# Patient Record
Sex: Male | Born: 1945 | Race: White | Hispanic: No | Marital: Married | Smoking: Never smoker
Health system: Southern US, Community
[De-identification: ages and names within clinical notes are randomized; demographics above are authoritative.]

## PROBLEM LIST (undated history)

## (undated) DIAGNOSIS — C61 Malignant neoplasm of prostate: Secondary | ICD-10-CM

## (undated) DIAGNOSIS — H409 Unspecified glaucoma: Secondary | ICD-10-CM

## (undated) DIAGNOSIS — S61209A Unspecified open wound of unspecified finger without damage to nail, initial encounter: Secondary | ICD-10-CM

## (undated) DIAGNOSIS — S61219A Laceration without foreign body of unspecified finger without damage to nail, initial encounter: Secondary | ICD-10-CM

## (undated) HISTORY — PX: PROSTATE BIOPSY: SHX241

## (undated) HISTORY — PX: OTHER SURGICAL HISTORY: SHX169

## (undated) HISTORY — PX: EYE SURGERY: SHX253

---

## 2009-08-07 HISTORY — PX: OTHER SURGICAL HISTORY: SHX169

## 2011-09-07 DIAGNOSIS — L259 Unspecified contact dermatitis, unspecified cause: Secondary | ICD-10-CM | POA: Diagnosis not present

## 2011-09-07 DIAGNOSIS — D1801 Hemangioma of skin and subcutaneous tissue: Secondary | ICD-10-CM | POA: Diagnosis not present

## 2011-09-07 DIAGNOSIS — D235 Other benign neoplasm of skin of trunk: Secondary | ICD-10-CM | POA: Diagnosis not present

## 2011-10-11 DIAGNOSIS — Z131 Encounter for screening for diabetes mellitus: Secondary | ICD-10-CM | POA: Diagnosis not present

## 2011-10-11 DIAGNOSIS — Z23 Encounter for immunization: Secondary | ICD-10-CM | POA: Diagnosis not present

## 2011-10-11 DIAGNOSIS — Z0000 Encounter for general adult medical examination without abnormal findings: Secondary | ICD-10-CM | POA: Diagnosis not present

## 2011-10-11 DIAGNOSIS — Z136 Encounter for screening for cardiovascular disorders: Secondary | ICD-10-CM | POA: Diagnosis not present

## 2011-10-15 DIAGNOSIS — L03119 Cellulitis of unspecified part of limb: Secondary | ICD-10-CM | POA: Diagnosis not present

## 2011-12-11 DIAGNOSIS — L719 Rosacea, unspecified: Secondary | ICD-10-CM | POA: Diagnosis not present

## 2012-02-05 DIAGNOSIS — L739 Follicular disorder, unspecified: Secondary | ICD-10-CM | POA: Diagnosis not present

## 2012-02-05 DIAGNOSIS — L719 Rosacea, unspecified: Secondary | ICD-10-CM | POA: Diagnosis not present

## 2012-02-05 DIAGNOSIS — D235 Other benign neoplasm of skin of trunk: Secondary | ICD-10-CM | POA: Diagnosis not present

## 2012-03-25 DIAGNOSIS — M659 Synovitis and tenosynovitis, unspecified: Secondary | ICD-10-CM | POA: Diagnosis not present

## 2012-05-09 DIAGNOSIS — N401 Enlarged prostate with lower urinary tract symptoms: Secondary | ICD-10-CM | POA: Diagnosis not present

## 2012-05-09 DIAGNOSIS — R972 Elevated prostate specific antigen [PSA]: Secondary | ICD-10-CM | POA: Diagnosis not present

## 2012-10-04 DIAGNOSIS — H47099 Other disorders of optic nerve, not elsewhere classified, unspecified eye: Secondary | ICD-10-CM | POA: Diagnosis not present

## 2013-02-05 DIAGNOSIS — L819 Disorder of pigmentation, unspecified: Secondary | ICD-10-CM | POA: Diagnosis not present

## 2013-02-05 DIAGNOSIS — D235 Other benign neoplasm of skin of trunk: Secondary | ICD-10-CM | POA: Diagnosis not present

## 2013-05-01 DIAGNOSIS — N401 Enlarged prostate with lower urinary tract symptoms: Secondary | ICD-10-CM | POA: Diagnosis not present

## 2013-05-08 DIAGNOSIS — R972 Elevated prostate specific antigen [PSA]: Secondary | ICD-10-CM | POA: Diagnosis not present

## 2013-12-09 DIAGNOSIS — W57XXXA Bitten or stung by nonvenomous insect and other nonvenomous arthropods, initial encounter: Secondary | ICD-10-CM | POA: Diagnosis not present

## 2013-12-09 DIAGNOSIS — T148 Other injury of unspecified body region: Secondary | ICD-10-CM | POA: Diagnosis not present

## 2014-02-10 DIAGNOSIS — H40019 Open angle with borderline findings, low risk, unspecified eye: Secondary | ICD-10-CM | POA: Diagnosis not present

## 2014-02-10 DIAGNOSIS — H40009 Preglaucoma, unspecified, unspecified eye: Secondary | ICD-10-CM | POA: Diagnosis not present

## 2014-03-03 DIAGNOSIS — H5340 Unspecified visual field defects: Secondary | ICD-10-CM | POA: Diagnosis not present

## 2014-03-03 DIAGNOSIS — H40059 Ocular hypertension, unspecified eye: Secondary | ICD-10-CM | POA: Diagnosis not present

## 2014-03-03 DIAGNOSIS — H40019 Open angle with borderline findings, low risk, unspecified eye: Secondary | ICD-10-CM | POA: Diagnosis not present

## 2014-03-13 DIAGNOSIS — H0289 Other specified disorders of eyelid: Secondary | ICD-10-CM | POA: Diagnosis not present

## 2014-04-23 DIAGNOSIS — E7800 Pure hypercholesterolemia, unspecified: Secondary | ICD-10-CM | POA: Diagnosis not present

## 2014-04-23 DIAGNOSIS — Z0000 Encounter for general adult medical examination without abnormal findings: Secondary | ICD-10-CM | POA: Diagnosis not present

## 2014-04-23 DIAGNOSIS — Z131 Encounter for screening for diabetes mellitus: Secondary | ICD-10-CM | POA: Diagnosis not present

## 2014-04-23 DIAGNOSIS — Z23 Encounter for immunization: Secondary | ICD-10-CM | POA: Diagnosis not present

## 2014-05-05 DIAGNOSIS — R972 Elevated prostate specific antigen [PSA]: Secondary | ICD-10-CM | POA: Diagnosis not present

## 2014-05-05 DIAGNOSIS — N401 Enlarged prostate with lower urinary tract symptoms: Secondary | ICD-10-CM | POA: Diagnosis not present

## 2014-05-06 DIAGNOSIS — L819 Disorder of pigmentation, unspecified: Secondary | ICD-10-CM | POA: Diagnosis not present

## 2014-05-06 DIAGNOSIS — D235 Other benign neoplasm of skin of trunk: Secondary | ICD-10-CM | POA: Diagnosis not present

## 2014-05-06 DIAGNOSIS — L821 Other seborrheic keratosis: Secondary | ICD-10-CM | POA: Diagnosis not present

## 2014-05-27 DIAGNOSIS — R972 Elevated prostate specific antigen [PSA]: Secondary | ICD-10-CM | POA: Diagnosis not present

## 2014-05-27 DIAGNOSIS — N401 Enlarged prostate with lower urinary tract symptoms: Secondary | ICD-10-CM | POA: Diagnosis not present

## 2014-05-27 DIAGNOSIS — R351 Nocturia: Secondary | ICD-10-CM | POA: Diagnosis not present

## 2014-09-14 DIAGNOSIS — H43813 Vitreous degeneration, bilateral: Secondary | ICD-10-CM | POA: Diagnosis not present

## 2014-09-14 DIAGNOSIS — H40013 Open angle with borderline findings, low risk, bilateral: Secondary | ICD-10-CM | POA: Diagnosis not present

## 2014-09-14 DIAGNOSIS — H01001 Unspecified blepharitis right upper eyelid: Secondary | ICD-10-CM | POA: Diagnosis not present

## 2014-09-14 DIAGNOSIS — H2513 Age-related nuclear cataract, bilateral: Secondary | ICD-10-CM | POA: Diagnosis not present

## 2014-12-04 DIAGNOSIS — H40013 Open angle with borderline findings, low risk, bilateral: Secondary | ICD-10-CM | POA: Diagnosis not present

## 2014-12-09 DIAGNOSIS — L282 Other prurigo: Secondary | ICD-10-CM | POA: Diagnosis not present

## 2014-12-09 DIAGNOSIS — L237 Allergic contact dermatitis due to plants, except food: Secondary | ICD-10-CM | POA: Diagnosis not present

## 2015-05-05 DIAGNOSIS — Z91038 Other insect allergy status: Secondary | ICD-10-CM | POA: Diagnosis not present

## 2015-05-05 DIAGNOSIS — Z0000 Encounter for general adult medical examination without abnormal findings: Secondary | ICD-10-CM | POA: Diagnosis not present

## 2015-05-05 DIAGNOSIS — E780 Pure hypercholesterolemia: Secondary | ICD-10-CM | POA: Diagnosis not present

## 2015-05-05 DIAGNOSIS — Z23 Encounter for immunization: Secondary | ICD-10-CM | POA: Diagnosis not present

## 2015-05-05 DIAGNOSIS — R944 Abnormal results of kidney function studies: Secondary | ICD-10-CM | POA: Diagnosis not present

## 2015-05-11 DIAGNOSIS — H40013 Open angle with borderline findings, low risk, bilateral: Secondary | ICD-10-CM | POA: Diagnosis not present

## 2015-05-12 DIAGNOSIS — D1801 Hemangioma of skin and subcutaneous tissue: Secondary | ICD-10-CM | POA: Diagnosis not present

## 2015-05-12 DIAGNOSIS — D485 Neoplasm of uncertain behavior of skin: Secondary | ICD-10-CM | POA: Diagnosis not present

## 2015-05-12 DIAGNOSIS — D225 Melanocytic nevi of trunk: Secondary | ICD-10-CM | POA: Diagnosis not present

## 2015-05-12 DIAGNOSIS — L919 Hypertrophic disorder of the skin, unspecified: Secondary | ICD-10-CM | POA: Diagnosis not present

## 2015-05-24 DIAGNOSIS — R972 Elevated prostate specific antigen [PSA]: Secondary | ICD-10-CM | POA: Diagnosis not present

## 2015-06-01 DIAGNOSIS — N401 Enlarged prostate with lower urinary tract symptoms: Secondary | ICD-10-CM | POA: Diagnosis not present

## 2015-06-01 DIAGNOSIS — R351 Nocturia: Secondary | ICD-10-CM | POA: Diagnosis not present

## 2015-06-01 DIAGNOSIS — N138 Other obstructive and reflux uropathy: Secondary | ICD-10-CM | POA: Diagnosis not present

## 2015-06-01 DIAGNOSIS — R972 Elevated prostate specific antigen [PSA]: Secondary | ICD-10-CM | POA: Diagnosis not present

## 2015-06-22 DIAGNOSIS — H40013 Open angle with borderline findings, low risk, bilateral: Secondary | ICD-10-CM | POA: Diagnosis not present

## 2015-11-15 DIAGNOSIS — M7022 Olecranon bursitis, left elbow: Secondary | ICD-10-CM | POA: Diagnosis not present

## 2015-11-30 DIAGNOSIS — R972 Elevated prostate specific antigen [PSA]: Secondary | ICD-10-CM | POA: Diagnosis not present

## 2015-12-23 DIAGNOSIS — C61 Malignant neoplasm of prostate: Secondary | ICD-10-CM | POA: Diagnosis not present

## 2015-12-23 DIAGNOSIS — R972 Elevated prostate specific antigen [PSA]: Secondary | ICD-10-CM | POA: Diagnosis not present

## 2015-12-30 DIAGNOSIS — Z0000 Encounter for general adult medical examination without abnormal findings: Secondary | ICD-10-CM | POA: Diagnosis not present

## 2015-12-30 DIAGNOSIS — C61 Malignant neoplasm of prostate: Secondary | ICD-10-CM | POA: Diagnosis not present

## 2015-12-31 DIAGNOSIS — H524 Presbyopia: Secondary | ICD-10-CM | POA: Diagnosis not present

## 2015-12-31 DIAGNOSIS — H40012 Open angle with borderline findings, low risk, left eye: Secondary | ICD-10-CM | POA: Diagnosis not present

## 2015-12-31 DIAGNOSIS — H40011 Open angle with borderline findings, low risk, right eye: Secondary | ICD-10-CM | POA: Diagnosis not present

## 2015-12-31 DIAGNOSIS — H2513 Age-related nuclear cataract, bilateral: Secondary | ICD-10-CM | POA: Diagnosis not present

## 2016-02-01 DIAGNOSIS — L812 Freckles: Secondary | ICD-10-CM | POA: Diagnosis not present

## 2016-02-01 DIAGNOSIS — D225 Melanocytic nevi of trunk: Secondary | ICD-10-CM | POA: Diagnosis not present

## 2016-02-01 DIAGNOSIS — D1801 Hemangioma of skin and subcutaneous tissue: Secondary | ICD-10-CM | POA: Diagnosis not present

## 2016-05-30 DIAGNOSIS — H40011 Open angle with borderline findings, low risk, right eye: Secondary | ICD-10-CM | POA: Diagnosis not present

## 2016-05-30 DIAGNOSIS — H40012 Open angle with borderline findings, low risk, left eye: Secondary | ICD-10-CM | POA: Diagnosis not present

## 2016-06-06 DIAGNOSIS — R944 Abnormal results of kidney function studies: Secondary | ICD-10-CM | POA: Diagnosis not present

## 2016-06-06 DIAGNOSIS — Z0000 Encounter for general adult medical examination without abnormal findings: Secondary | ICD-10-CM | POA: Diagnosis not present

## 2016-06-06 DIAGNOSIS — C61 Malignant neoplasm of prostate: Secondary | ICD-10-CM | POA: Diagnosis not present

## 2016-06-06 DIAGNOSIS — E7800 Pure hypercholesterolemia, unspecified: Secondary | ICD-10-CM | POA: Diagnosis not present

## 2016-06-26 DIAGNOSIS — C61 Malignant neoplasm of prostate: Secondary | ICD-10-CM | POA: Diagnosis not present

## 2016-07-04 DIAGNOSIS — C61 Malignant neoplasm of prostate: Secondary | ICD-10-CM | POA: Diagnosis not present

## 2016-07-04 DIAGNOSIS — N400 Enlarged prostate without lower urinary tract symptoms: Secondary | ICD-10-CM | POA: Diagnosis not present

## 2016-10-03 DIAGNOSIS — H40013 Open angle with borderline findings, low risk, bilateral: Secondary | ICD-10-CM | POA: Diagnosis not present

## 2016-12-26 DIAGNOSIS — C61 Malignant neoplasm of prostate: Secondary | ICD-10-CM | POA: Diagnosis not present

## 2017-01-02 DIAGNOSIS — N400 Enlarged prostate without lower urinary tract symptoms: Secondary | ICD-10-CM | POA: Diagnosis not present

## 2017-01-02 DIAGNOSIS — C61 Malignant neoplasm of prostate: Secondary | ICD-10-CM | POA: Diagnosis not present

## 2017-01-22 DIAGNOSIS — H43813 Vitreous degeneration, bilateral: Secondary | ICD-10-CM | POA: Diagnosis not present

## 2017-01-22 DIAGNOSIS — H524 Presbyopia: Secondary | ICD-10-CM | POA: Diagnosis not present

## 2017-01-22 DIAGNOSIS — H40013 Open angle with borderline findings, low risk, bilateral: Secondary | ICD-10-CM | POA: Diagnosis not present

## 2017-01-22 DIAGNOSIS — H2513 Age-related nuclear cataract, bilateral: Secondary | ICD-10-CM | POA: Diagnosis not present

## 2017-01-30 DIAGNOSIS — D225 Melanocytic nevi of trunk: Secondary | ICD-10-CM | POA: Diagnosis not present

## 2017-01-30 DIAGNOSIS — D485 Neoplasm of uncertain behavior of skin: Secondary | ICD-10-CM | POA: Diagnosis not present

## 2017-01-30 DIAGNOSIS — L821 Other seborrheic keratosis: Secondary | ICD-10-CM | POA: Diagnosis not present

## 2017-01-30 DIAGNOSIS — L814 Other melanin hyperpigmentation: Secondary | ICD-10-CM | POA: Diagnosis not present

## 2017-01-30 DIAGNOSIS — L84 Corns and callosities: Secondary | ICD-10-CM | POA: Diagnosis not present

## 2017-06-26 DIAGNOSIS — Z23 Encounter for immunization: Secondary | ICD-10-CM | POA: Diagnosis not present

## 2017-06-26 DIAGNOSIS — R944 Abnormal results of kidney function studies: Secondary | ICD-10-CM | POA: Diagnosis not present

## 2017-06-26 DIAGNOSIS — E7800 Pure hypercholesterolemia, unspecified: Secondary | ICD-10-CM | POA: Diagnosis not present

## 2017-06-26 DIAGNOSIS — Z131 Encounter for screening for diabetes mellitus: Secondary | ICD-10-CM | POA: Diagnosis not present

## 2017-06-26 DIAGNOSIS — Z0000 Encounter for general adult medical examination without abnormal findings: Secondary | ICD-10-CM | POA: Diagnosis not present

## 2017-06-26 DIAGNOSIS — C61 Malignant neoplasm of prostate: Secondary | ICD-10-CM | POA: Diagnosis not present

## 2017-07-03 DIAGNOSIS — C61 Malignant neoplasm of prostate: Secondary | ICD-10-CM | POA: Diagnosis not present

## 2017-07-10 DIAGNOSIS — C61 Malignant neoplasm of prostate: Secondary | ICD-10-CM | POA: Diagnosis not present

## 2017-07-10 DIAGNOSIS — R351 Nocturia: Secondary | ICD-10-CM | POA: Diagnosis not present

## 2017-07-24 DIAGNOSIS — H401132 Primary open-angle glaucoma, bilateral, moderate stage: Secondary | ICD-10-CM | POA: Diagnosis not present

## 2017-10-08 DIAGNOSIS — C61 Malignant neoplasm of prostate: Secondary | ICD-10-CM | POA: Diagnosis not present

## 2017-12-25 DIAGNOSIS — D3709 Neoplasm of uncertain behavior of other specified sites of the oral cavity: Secondary | ICD-10-CM | POA: Diagnosis not present

## 2018-01-03 DIAGNOSIS — K136 Irritative hyperplasia of oral mucosa: Secondary | ICD-10-CM | POA: Diagnosis not present

## 2018-01-07 DIAGNOSIS — C61 Malignant neoplasm of prostate: Secondary | ICD-10-CM | POA: Diagnosis not present

## 2018-01-14 ENCOUNTER — Other Ambulatory Visit: Payer: Self-pay | Admitting: Urology

## 2018-01-14 DIAGNOSIS — C61 Malignant neoplasm of prostate: Secondary | ICD-10-CM

## 2018-01-14 DIAGNOSIS — N401 Enlarged prostate with lower urinary tract symptoms: Secondary | ICD-10-CM | POA: Diagnosis not present

## 2018-01-14 DIAGNOSIS — R351 Nocturia: Secondary | ICD-10-CM | POA: Diagnosis not present

## 2018-01-24 ENCOUNTER — Other Ambulatory Visit: Payer: Self-pay | Admitting: Urology

## 2018-01-24 ENCOUNTER — Ambulatory Visit
Admission: RE | Admit: 2018-01-24 | Discharge: 2018-01-24 | Disposition: A | Discharge status: Home / Self Care | Payer: Medicare Other | Source: Ambulatory Visit | Attending: Urology | Admitting: Urology

## 2018-01-24 DIAGNOSIS — C61 Malignant neoplasm of prostate: Secondary | ICD-10-CM

## 2018-01-24 MED ORDER — GADOBENATE DIMEGLUMINE 529 MG/ML IV SOLN
17.0000 mL | Freq: Once | INTRAVENOUS | Status: AC | PRN
  Administered 2018-01-24: 17 mL via INTRAVENOUS

## 2018-02-06 DIAGNOSIS — L03113 Cellulitis of right upper limb: Secondary | ICD-10-CM | POA: Diagnosis not present

## 2018-02-06 DIAGNOSIS — L255 Unspecified contact dermatitis due to plants, except food: Secondary | ICD-10-CM | POA: Diagnosis not present

## 2018-02-15 DIAGNOSIS — Z23 Encounter for immunization: Secondary | ICD-10-CM | POA: Diagnosis not present

## 2018-02-15 DIAGNOSIS — S61216A Laceration without foreign body of right little finger without damage to nail, initial encounter: Secondary | ICD-10-CM | POA: Diagnosis not present

## 2018-03-04 DIAGNOSIS — L578 Other skin changes due to chronic exposure to nonionizing radiation: Secondary | ICD-10-CM | POA: Diagnosis not present

## 2018-03-04 DIAGNOSIS — L821 Other seborrheic keratosis: Secondary | ICD-10-CM | POA: Diagnosis not present

## 2018-03-04 DIAGNOSIS — D229 Melanocytic nevi, unspecified: Secondary | ICD-10-CM | POA: Diagnosis not present

## 2018-03-04 DIAGNOSIS — L814 Other melanin hyperpigmentation: Secondary | ICD-10-CM | POA: Diagnosis not present

## 2018-03-04 DIAGNOSIS — D1801 Hemangioma of skin and subcutaneous tissue: Secondary | ICD-10-CM | POA: Diagnosis not present

## 2018-03-04 DIAGNOSIS — L905 Scar conditions and fibrosis of skin: Secondary | ICD-10-CM | POA: Diagnosis not present

## 2018-05-01 DIAGNOSIS — C61 Malignant neoplasm of prostate: Secondary | ICD-10-CM | POA: Diagnosis not present

## 2018-06-28 DIAGNOSIS — Z131 Encounter for screening for diabetes mellitus: Secondary | ICD-10-CM | POA: Diagnosis not present

## 2018-06-28 DIAGNOSIS — E7800 Pure hypercholesterolemia, unspecified: Secondary | ICD-10-CM | POA: Diagnosis not present

## 2018-07-03 DIAGNOSIS — H2513 Age-related nuclear cataract, bilateral: Secondary | ICD-10-CM | POA: Diagnosis not present

## 2018-07-03 DIAGNOSIS — H401132 Primary open-angle glaucoma, bilateral, moderate stage: Secondary | ICD-10-CM | POA: Diagnosis not present

## 2018-07-03 DIAGNOSIS — Z1211 Encounter for screening for malignant neoplasm of colon: Secondary | ICD-10-CM | POA: Diagnosis not present

## 2018-07-03 DIAGNOSIS — H524 Presbyopia: Secondary | ICD-10-CM | POA: Diagnosis not present

## 2018-07-03 DIAGNOSIS — H43813 Vitreous degeneration, bilateral: Secondary | ICD-10-CM | POA: Diagnosis not present

## 2018-07-03 DIAGNOSIS — Z0000 Encounter for general adult medical examination without abnormal findings: Secondary | ICD-10-CM | POA: Diagnosis not present

## 2018-07-03 DIAGNOSIS — E7800 Pure hypercholesterolemia, unspecified: Secondary | ICD-10-CM | POA: Diagnosis not present

## 2018-07-03 DIAGNOSIS — R740 Nonspecific elevation of levels of transaminase and lactic acid dehydrogenase [LDH]: Secondary | ICD-10-CM | POA: Diagnosis not present

## 2018-07-17 DIAGNOSIS — Z1211 Encounter for screening for malignant neoplasm of colon: Secondary | ICD-10-CM | POA: Diagnosis not present

## 2018-07-22 DIAGNOSIS — C61 Malignant neoplasm of prostate: Secondary | ICD-10-CM | POA: Diagnosis not present

## 2018-08-05 DIAGNOSIS — N401 Enlarged prostate with lower urinary tract symptoms: Secondary | ICD-10-CM | POA: Diagnosis not present

## 2018-08-05 DIAGNOSIS — R351 Nocturia: Secondary | ICD-10-CM | POA: Diagnosis not present

## 2018-08-05 DIAGNOSIS — C61 Malignant neoplasm of prostate: Secondary | ICD-10-CM | POA: Diagnosis not present

## 2018-12-01 DIAGNOSIS — L255 Unspecified contact dermatitis due to plants, except food: Secondary | ICD-10-CM | POA: Diagnosis not present

## 2019-01-03 DIAGNOSIS — H401132 Primary open-angle glaucoma, bilateral, moderate stage: Secondary | ICD-10-CM | POA: Diagnosis not present

## 2019-01-15 DIAGNOSIS — D229 Melanocytic nevi, unspecified: Secondary | ICD-10-CM | POA: Diagnosis not present

## 2019-01-15 DIAGNOSIS — Z872 Personal history of diseases of the skin and subcutaneous tissue: Secondary | ICD-10-CM | POA: Diagnosis not present

## 2019-01-15 DIAGNOSIS — L821 Other seborrheic keratosis: Secondary | ICD-10-CM | POA: Diagnosis not present

## 2019-01-15 DIAGNOSIS — L814 Other melanin hyperpigmentation: Secondary | ICD-10-CM | POA: Diagnosis not present

## 2019-01-15 DIAGNOSIS — D1801 Hemangioma of skin and subcutaneous tissue: Secondary | ICD-10-CM | POA: Diagnosis not present

## 2019-01-15 DIAGNOSIS — L819 Disorder of pigmentation, unspecified: Secondary | ICD-10-CM | POA: Diagnosis not present

## 2019-01-27 DIAGNOSIS — C61 Malignant neoplasm of prostate: Secondary | ICD-10-CM | POA: Diagnosis not present

## 2019-02-03 DIAGNOSIS — N400 Enlarged prostate without lower urinary tract symptoms: Secondary | ICD-10-CM | POA: Diagnosis not present

## 2019-02-03 DIAGNOSIS — C61 Malignant neoplasm of prostate: Secondary | ICD-10-CM | POA: Diagnosis not present

## 2019-05-13 DIAGNOSIS — Z23 Encounter for immunization: Secondary | ICD-10-CM | POA: Diagnosis not present

## 2019-07-28 DIAGNOSIS — C61 Malignant neoplasm of prostate: Secondary | ICD-10-CM | POA: Diagnosis not present

## 2019-10-22 DIAGNOSIS — H401132 Primary open-angle glaucoma, bilateral, moderate stage: Secondary | ICD-10-CM | POA: Diagnosis not present

## 2019-10-22 DIAGNOSIS — H43813 Vitreous degeneration, bilateral: Secondary | ICD-10-CM | POA: Diagnosis not present

## 2019-10-22 DIAGNOSIS — H2513 Age-related nuclear cataract, bilateral: Secondary | ICD-10-CM | POA: Diagnosis not present

## 2019-10-22 DIAGNOSIS — H524 Presbyopia: Secondary | ICD-10-CM | POA: Diagnosis not present

## 2019-11-24 DIAGNOSIS — C61 Malignant neoplasm of prostate: Secondary | ICD-10-CM | POA: Diagnosis not present

## 2019-11-28 ENCOUNTER — Other Ambulatory Visit: Payer: Self-pay | Admitting: Urology

## 2019-11-28 DIAGNOSIS — C61 Malignant neoplasm of prostate: Secondary | ICD-10-CM

## 2019-12-11 DIAGNOSIS — H25012 Cortical age-related cataract, left eye: Secondary | ICD-10-CM | POA: Diagnosis not present

## 2019-12-11 DIAGNOSIS — H25812 Combined forms of age-related cataract, left eye: Secondary | ICD-10-CM | POA: Diagnosis not present

## 2019-12-11 DIAGNOSIS — H52222 Regular astigmatism, left eye: Secondary | ICD-10-CM | POA: Diagnosis not present

## 2019-12-11 DIAGNOSIS — H2512 Age-related nuclear cataract, left eye: Secondary | ICD-10-CM | POA: Diagnosis not present

## 2019-12-25 ENCOUNTER — Ambulatory Visit
Admission: RE | Admit: 2019-12-25 | Discharge: 2019-12-25 | Disposition: A | Discharge status: Home / Self Care | Payer: Medicare Other | Source: Ambulatory Visit | Attending: Urology | Admitting: Urology

## 2019-12-25 ENCOUNTER — Other Ambulatory Visit: Payer: Self-pay

## 2019-12-25 DIAGNOSIS — C61 Malignant neoplasm of prostate: Secondary | ICD-10-CM

## 2019-12-25 MED ORDER — GADOBENATE DIMEGLUMINE 529 MG/ML IV SOLN
17.0000 mL | Freq: Once | INTRAVENOUS | Status: AC | PRN
  Administered 2019-12-25: 17 mL via INTRAVENOUS

## 2020-01-13 DIAGNOSIS — C61 Malignant neoplasm of prostate: Secondary | ICD-10-CM | POA: Diagnosis not present

## 2020-01-13 DIAGNOSIS — D075 Carcinoma in situ of prostate: Secondary | ICD-10-CM | POA: Diagnosis not present

## 2020-01-20 ENCOUNTER — Other Ambulatory Visit (HOSPITAL_COMMUNITY): Payer: Self-pay | Admitting: Urology

## 2020-01-20 DIAGNOSIS — C61 Malignant neoplasm of prostate: Secondary | ICD-10-CM

## 2020-01-22 DIAGNOSIS — H25011 Cortical age-related cataract, right eye: Secondary | ICD-10-CM | POA: Diagnosis not present

## 2020-01-22 DIAGNOSIS — H52221 Regular astigmatism, right eye: Secondary | ICD-10-CM | POA: Diagnosis not present

## 2020-01-22 DIAGNOSIS — H2511 Age-related nuclear cataract, right eye: Secondary | ICD-10-CM | POA: Diagnosis not present

## 2020-01-22 DIAGNOSIS — H25811 Combined forms of age-related cataract, right eye: Secondary | ICD-10-CM | POA: Diagnosis not present

## 2020-01-28 DIAGNOSIS — C61 Malignant neoplasm of prostate: Secondary | ICD-10-CM | POA: Diagnosis not present

## 2020-02-03 ENCOUNTER — Encounter (HOSPITAL_COMMUNITY)
Admission: RE | Admit: 2020-02-03 | Discharge: 2020-02-03 | Disposition: A | Discharge status: Home / Self Care | Payer: Medicare Other | Source: Ambulatory Visit | Attending: Urology | Admitting: Urology

## 2020-02-03 ENCOUNTER — Other Ambulatory Visit: Payer: Self-pay

## 2020-02-03 DIAGNOSIS — C61 Malignant neoplasm of prostate: Secondary | ICD-10-CM | POA: Diagnosis not present

## 2020-02-03 MED ORDER — TECHNETIUM TC 99M MEDRONATE IV KIT
20.1000 | PACK | Freq: Once | INTRAVENOUS | Status: AC
  Administered 2020-02-03: 20.1 via INTRAVENOUS

## 2020-02-06 ENCOUNTER — Telehealth: Payer: Self-pay | Admitting: Radiation Oncology

## 2020-02-06 NOTE — Telephone Encounter (Signed)
Pt called to confirm his appt w/Dr. Tammi Klippel on 7/23.

## 2020-02-26 ENCOUNTER — Encounter: Payer: Self-pay | Admitting: Radiation Oncology

## 2020-02-26 NOTE — Progress Notes (Signed)
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 Prostate volume: 52 cc.  Mitchell Curtis was diagnosed with prostate ca in 2017 and opted for active surveillance. Reports Dr. Karsten Ro began checking his PSA every six months at that point. Patient explains it wasn't until two years ago his PSA began increasing. Unfortunately, biopsy done June 2021 revealed progression.  Biopsies of prostate (if applicable) revealed:    Past/Anticipated interventions by urology, if any: prostate biopsy, active surveillance, prostate biopsy, CT scan (negative), bone scan (negative), Eligard 45 administered 02/04/2020, referral to Dr. Tammi Klippel to discuss radiation therapy options.  Past/Anticipated interventions by medical oncology, if any: no  Weight changes, if any: no  Bowel/Bladder complaints, if any: IPSS 16. IPSS 1. Reports dysuria is unchanged. Denies hematuria. Denies urinary leakage despite urgency. Reports intermittent diarrhea since receiving Eligard.    Nausea/Vomiting, if any: no  Pain issues, if any:  denies  SAFETY ISSUES:  Prior radiation? denies  Pacemaker/ICD? denies  Possible current pregnancy? no, male patient  Is the patient on methotrexate? denies  Current Complaints / other details:  74 year old male. Married. Retired. Resides in [place]. Brother with history of prostate cancer. Reports his mother had melanoma. Reports he is catholic.

## 2020-02-27 ENCOUNTER — Encounter: Payer: Self-pay | Admitting: Radiation Oncology

## 2020-02-27 ENCOUNTER — Ambulatory Visit
Admission: RE | Admit: 2020-02-27 | Discharge: 2020-02-27 | Disposition: A | Discharge status: Home / Self Care | Payer: Medicare Other | Source: Ambulatory Visit | Attending: Radiation Oncology | Admitting: Radiation Oncology

## 2020-02-27 ENCOUNTER — Other Ambulatory Visit: Payer: Self-pay

## 2020-02-27 ENCOUNTER — Encounter: Payer: Self-pay | Admitting: Medical Oncology

## 2020-02-27 VITALS — BP 138/85 | HR 81 | Temp 98.9°F | Resp 20 | Ht 71.0 in | Wt 195.4 lb

## 2020-02-27 DIAGNOSIS — C61 Malignant neoplasm of prostate: Secondary | ICD-10-CM

## 2020-02-27 DIAGNOSIS — R972 Elevated prostate specific antigen [PSA]: Secondary | ICD-10-CM | POA: Diagnosis not present

## 2020-02-27 DIAGNOSIS — Z9289 Personal history of other medical treatment: Secondary | ICD-10-CM | POA: Diagnosis not present

## 2020-02-27 DIAGNOSIS — Z79899 Other long term (current) drug therapy: Secondary | ICD-10-CM | POA: Insufficient documentation

## 2020-02-27 HISTORY — DX: Malignant neoplasm of prostate: C61

## 2020-02-27 NOTE — Progress Notes (Signed)
Radiation Oncology         [phone] ________________________________  Initial outpatient Consultation  Name: Mitchell Curtis MRN: [id number]  Date: 02/27/2020  DOB: 10-06-1945  BS:JGGE, Dwyane Luo, MD  Kathie Rhodes, MD   REFERRING PHYSICIAN: Kathie Rhodes, MD  DIAGNOSIS: 74 y.o. gentleman with Stage T1c adenocarcinoma of the prostate with Gleason score of 4+5, and PSA of 11.    ICD-10-CM   1. Malignant neoplasm of prostate ([place])  C61     HISTORY OF PRESENT ILLNESS: Mitchell Curtis is a 74 y.o. male with a diagnosis of prostate cancer. He was initially referred to Dr. Karsten Ro in 04/2005 when his PSA increased from 2.6 to 4.0. Biopsy performed in 06/2005 was benign with a minute focus of atypical glands. Following the procedure, his PSA remained stable and his DRE remained benign until 2017. His PSA rose to 7.84 in 11/2015, prompting repeat biopsy on 12/23/2015. Pathology showed one core (out of 12) with Gleason 3+3 prostate cancer. He elected to proceed in active surveillance at that time. Surveillance prostate MRI performed on 01/24/2018 showed no evidence of high-grade prostate carcinoma.  His PSA reached 10.3 in 07/2019 and 11 in 11/2019. This prompted a repeat prostate MRI on 12/25/2019 which showed a 19 mm, PI-RADS 5 peripheral zone nodule in the left lateral mid gland and base with probable extracapsular extension and left seminal vesicle involvement but no evidence of pelvic lymphadenopathy or bone metastases.  The patient proceeded to MRI fusion biopsy of the prostate on 01/13/2020.  The prostate volume measured 51.74 cc.  Out of 17 core biopsies, 3 were positive.  All 12 of the standard core biopsies, as well as 2 left seminal vesicle samples, were benign with one core of HG/PIN. The maximum Gleason score was 4+5, and this was seen in all three ROI MRI lesion cores with perineural invasion.  He underwent staging scans on 02/03/2020. CT A/P and bone scan both showed no evidence of metastatic  disease.  The patient reviewed the biopsy results with his urologist and he has kindly been referred today for discussion of potential radiation treatment options. He was started on ADT with a 53-month Eligard injection given on 02/04/2020 at the time of his last follow-up visit with Dr. Karsten Ro. With Dr. Simone Curia retirement, he will continue his care with Dr. Milford Cage.   PREVIOUS RADIATION THERAPY: No  PAST MEDICAL HISTORY:  Past Medical History:  Diagnosis Date  . Prostate cancer ([place])       PAST SURGICAL HISTORY:History reviewed. No pertinent surgical history.  FAMILY HISTORY: History reviewed. No pertinent family history.  SOCIAL HISTORY:  Social History   Socioeconomic History  . Marital status: Married    Spouse name: Not on file  . Number of children: Not on file  . Years of education: Not on file  . Highest education level: Not on file  Occupational History    Comment: retired  Tobacco Use  . Smoking status: Never Smoker  . Smokeless tobacco: Never Used  Vaping Use  . Vaping Use: Never used  Substance and Sexual Activity  . Alcohol use: Not Currently  . Drug use: Never  . Sexual activity: Yes  Other Topics Concern  . Not on file  Social History Narrative  . Not on file   Social Determinants of Health   Financial Resource Strain:   . Difficulty of Paying Living Expenses:   Food Insecurity:   . Worried About Charity fundraiser in the Last Year:   .  Ran Out of Food in the Last Year:   Transportation Needs:   . Film/video editor (Medical):   Marland Kitchen Lack of Transportation (Non-Medical):   Physical Activity:   . Days of Exercise per Week:   . Minutes of Exercise per Session:   Stress:   . Feeling of Stress :   Social Connections:   . Frequency of Communication with Friends and Family:   . Frequency of Social Gatherings with Friends and Family:   . Attends Religious Services:   . Active Member of Clubs or Organizations:   . Attends Archivist  Meetings:   Marland Kitchen Marital Status:   Intimate Partner Violence:   . Fear of Current or Ex-Partner:   . Emotionally Abused:   Marland Kitchen Physically Abused:   . Sexually Abused:     ALLERGIES: Bee venom  MEDICATIONS:  Current Outpatient Medications  Medication Sig Dispense Refill  . triamcinolone cream (KENALOG) 0.1 % Apply topically.    . latanoprost (XALATAN) 0.005 % ophthalmic solution     . leuprolide, 6 Month, (LEUPROLIDE ACETATE, 6 MONTH,) 45 MG injection Inject into the skin.    Marland Kitchen moxifloxacin (VIGAMOX) 0.5 % ophthalmic solution     . prednisoLONE acetate (PRED FORTE) 1 % ophthalmic suspension      No current facility-administered medications for this encounter.    REVIEW OF SYSTEMS:  On review of systems, the patient reports that he is doing well overall. He denies any chest pain, shortness of breath, cough, fevers, chills, night sweats, unintended weight changes. He denies any bowel disturbances, and denies abdominal pain, nausea or vomiting. He denies any new musculoskeletal or joint aches or pains. His IPSS was 7, indicating mild urinary symptoms. His SHIM was 1, indicating he does have severe erectile dysfunction but reports that he is not active and this is not of high priority. A complete review of systems is obtained and is otherwise negative.   PHYSICAL EXAM:  Wt Readings from Last 3 Encounters:  No data found for Wt   Temp Readings from Last 3 Encounters:  No data found for Temp   BP Readings from Last 3 Encounters:  No data found for BP   Pulse Readings from Last 3 Encounters:  No data found for Pulse    /10  In general this is a well appearing Caucasian male in no acute distress. He is alert and oriented x4 and appropriate throughout the examination. HEENT reveals that the patient is normocephalic, atraumatic. EOMs are intact. PERRLA. Skin is intact without any evidence of gross lesions. Cardiopulmonary assessment is negative for acute distress and he exhibits normal  effort. The abdomen is soft, non tender, non distended. Lower extremities are negative for pretibial pitting edema, deep calf tenderness, cyanosis or clubbing.   KPS = 100  100 - Normal; no complaints; no evidence of disease. 90   - Able to carry on normal activity; minor signs or symptoms of disease. 80   - Normal activity with effort; some signs or symptoms of disease. 100   - Cares for self; unable to carry on normal activity or to do active work. 60   - Requires occasional assistance, but is able to care for most of his personal needs. 50   - Requires considerable assistance and frequent medical care. 66   - Disabled; requires special care and assistance. 38   - Severely disabled; hospital admission is indicated although death not imminent. 7   - Very sick; hospital admission necessary;  active supportive treatment necessary. 10   - Moribund; fatal processes progressing rapidly. 0     - Dead  Karnofsky DA, Abelmann WH, Craver LS and Burchenal JH [phone]) The use of the nitrogen mustards in the palliative treatment of carcinoma: with particular reference to bronchogenic carcinoma Cancer 1 [phone]  LABORATORY DATA:  No results found for: WBC, HGB, HCT, MCV, PLT No results found for: NA, K, CL, CO2 No results found for: ALT, AST, GGT, ALKPHOS, BILITOT   RADIOGRAPHY: NM Bone Scan Whole Body  Result Date: 02/04/2020 CLINICAL DATA:  Prostate cancer. EXAM: NUCLEAR MEDICINE WHOLE BODY BONE SCAN TECHNIQUE: Whole body anterior and posterior images were obtained approximately 3 hours after intravenous injection of radiopharmaceutical. RADIOPHARMACEUTICALS:  20.1 mCi Technetium-11m MDP IV COMPARISON:  CT abdomen pelvis from same day. FINDINGS: There are no foci of increased or decreased radiotracer uptake to suggest osseous metastatic disease. Degenerative type uptake in both shoulders and knees. Normal physiologic activity is identified within the kidneys and urinary bladder. IMPRESSION: No evidence  of osseous metastatic disease. Electronically Signed   By: Titus Dubin M.D.   On: 02/04/2020 08:06      IMPRESSION/PLAN: 1. 74 y.o. gentleman with Stage T1c adenocarcinoma of the prostate with Gleason Score of 4+5, and PSA of 11. We discussed the patient's workup and outlined the nature of prostate cancer in this setting. The patient's T stage, Gleason's score, and PSA put him into the high risk group. Accordingly, he is eligible for a variety of potential treatment options including prostatectomy or LT-ADT in combination with either 8 weeks of external radiation or 5 weeks of external radiation with an upfront brachytherapy boost. We discussed the available radiation techniques, and focused on the details and logistics of delivery. We discussed and outlined the risks, benefits, short and long-term effects associated with radiotherapy and compared and contrasted these with prostatectomy. We discussed the role of SpaceOAR in reducing the rectal toxicity associated with radiotherapy. We also detailed the role of ADT in the treatment of high risk prostate cancer and outlined the associated side effects that could be expected with this therapy. He was encouraged to ask questions that were answered to his stated satisfaction  At the conclusion of our conversation, the patient is interested in moving forward with long-term ADT in combination with an upfront brachytherapy boost followed by a 5 week course of daily external beam radiotherapy. He is interested in having SpaceOAR gel placement at the time of his seed boost procedure to reduce the rectal toxicities of radiotherapy. He has already started ADT with 6 month Eligard injection on 02/04/20. Therefore, we will move forward with coordinating the  brachytherapy boost and SpaceOAR gel insertion at the outpatient surgical center in early September 2021, approximately 2 months from the start of ADT. We will plan to see him back in the office approximately 2  weeks following his procedure and will proceed with CT simulation in preparation for beginning 5 weeks of prostate external beam radiotherapy shortly thereafter. He appears to have a good understanding of his disease and our treatment recommendations which are of curative intent and is comfortable with and is in agreement with the stated plan.   We enjoyed meeting him and his wife today and look forward to participating in the care of this nice gentleman. We will share our findings with Dr. Milford Cage and move forward with coordinating his treatment.    Nicholos Johns, PA-C    Tyler Pita, MD  [facility] Direct Dial: [phone]  Fax: [phone] [email or web address]  Skype  LinkedIn   This document serves as a record of services personally performed by Tyler Pita, MD and Freeman Caldron, PA-C. It was created on their behalf by Wilburn Mylar, a trained medical scribe. The creation of this record is based on the scribe's personal observations and the provider's statements to them. This document has been checked and approved by the attending provider.

## 2020-02-27 NOTE — Progress Notes (Signed)
Introduced myself as the prostate nurse navigator and discussed my role.  Mitchell Curtis states he has been under active surveillance until his PSA started rising. His last biopsy revealed progression of his cancer. He received Eligard 45 mg on 6/30. He is experiencing hot flashes but no other symptoms. He is here to discuss his radiation options with Dr. Forrest Moron, PA. His brother  was diagnosed with prostate cancer approx 20 years and had surgery. He continues to do well. No other family history of prostate or breast cancer. Due to his age, he feels radiaiton is the best treatment option. I gave him my business card and asked him to reach out with questions or concerns. He voice understanding.

## 2020-03-03 ENCOUNTER — Telehealth: Payer: Self-pay | Admitting: *Deleted

## 2020-03-03 NOTE — Telephone Encounter (Signed)
CALLED PATIENT TO ASK QUESTIONS, LVM FOR A RETURN CALL 

## 2020-03-08 ENCOUNTER — Encounter: Payer: Self-pay | Admitting: Medical Oncology

## 2020-03-08 NOTE — Progress Notes (Signed)
Per [place] patient requested a call. He asked if  a booster for the COVID is approved,  would this affect his treatment for prostate cancer. I informed him that currently, we have encouraged patients to receive the COVID vaccines so no reason he would not be able to receive.

## 2020-03-09 DIAGNOSIS — L821 Other seborrheic keratosis: Secondary | ICD-10-CM | POA: Diagnosis not present

## 2020-03-09 DIAGNOSIS — B351 Tinea unguium: Secondary | ICD-10-CM | POA: Diagnosis not present

## 2020-03-09 DIAGNOSIS — L718 Other rosacea: Secondary | ICD-10-CM | POA: Diagnosis not present

## 2020-03-09 DIAGNOSIS — D692 Other nonthrombocytopenic purpura: Secondary | ICD-10-CM | POA: Diagnosis not present

## 2020-03-09 DIAGNOSIS — D225 Melanocytic nevi of trunk: Secondary | ICD-10-CM | POA: Diagnosis not present

## 2020-03-10 ENCOUNTER — Telehealth: Payer: Self-pay | Admitting: *Deleted

## 2020-03-10 NOTE — Telephone Encounter (Signed)
Called patient to inform of pre-seed appts. for 03-25-20 and his implant date of 04-19-20, spoke with patient and he is aware of these appts.

## 2020-03-17 ENCOUNTER — Other Ambulatory Visit: Payer: Self-pay | Admitting: Urology

## 2020-03-23 ENCOUNTER — Telehealth: Payer: Self-pay | Admitting: *Deleted

## 2020-03-23 NOTE — Telephone Encounter (Signed)
CALLED PATIENT TO REMIND OF PRE-SEED APPTS. FOR 03-25-20, SPOKE WITH PATIENT AND HE IS AWARE OF THESE APPTS.

## 2020-03-25 ENCOUNTER — Encounter (HOSPITAL_COMMUNITY)
Admission: RE | Admit: 2020-03-25 | Discharge: 2020-03-25 | Disposition: A | Discharge status: Home / Self Care | Payer: Medicare Other | Source: Ambulatory Visit | Attending: Urology | Admitting: Urology

## 2020-03-25 ENCOUNTER — Ambulatory Visit
Admission: RE | Admit: 2020-03-25 | Discharge: 2020-03-25 | Disposition: A | Discharge status: Home / Self Care | Payer: Medicare Other | Source: Ambulatory Visit | Attending: Radiation Oncology | Admitting: Radiation Oncology

## 2020-03-25 ENCOUNTER — Other Ambulatory Visit: Payer: Self-pay

## 2020-03-25 ENCOUNTER — Encounter: Payer: Self-pay | Admitting: Medical Oncology

## 2020-03-25 ENCOUNTER — Ambulatory Visit
Admission: RE | Admit: 2020-03-25 | Discharge: 2020-03-25 | Disposition: A | Discharge status: Home / Self Care | Payer: Medicare Other | Source: Ambulatory Visit | Attending: Urology | Admitting: Urology

## 2020-03-25 ENCOUNTER — Ambulatory Visit (HOSPITAL_COMMUNITY)
Admission: RE | Admit: 2020-03-25 | Discharge: 2020-03-25 | Disposition: A | Discharge status: Home / Self Care | Payer: Medicare Other | Source: Ambulatory Visit | Attending: Urology | Admitting: Urology

## 2020-03-25 DIAGNOSIS — Z01818 Encounter for other preprocedural examination: Secondary | ICD-10-CM | POA: Insufficient documentation

## 2020-03-25 DIAGNOSIS — C61 Malignant neoplasm of prostate: Secondary | ICD-10-CM | POA: Insufficient documentation

## 2020-03-25 NOTE — Progress Notes (Signed)
  Radiation Oncology         [phone]) [phone] ________________________________  Name: Mitchell Curtis MRN: [id number]  Date: 03/25/2020  DOB: [birth year]  SIMULATION AND TREATMENT PLANNING NOTE PUBIC ARCH STUDY  ME:QAST, Dwyane Luo, MD  Kathie Rhodes, MD  DIAGNOSIS: 74 y.o. gentleman with Stage T1c adenocarcinoma of the prostate with Gleason score of 4+5, and PSA of 11.  Oncology History  Malignant neoplasm of prostate ([place])  01/14/2020 Cancer Staging   Staging form: Prostate, AJCC 8th Edition - Clinical stage from 01/14/2020: Stage IIIC (cT1c, cN0, cM0, PSA: 11, Grade Group: 5) - Signed by Freeman Caldron, PA-C on 02/27/2020   02/27/2020 Initial Diagnosis   Malignant neoplasm of prostate ([place])       ICD-10-CM   1. Malignant neoplasm of prostate ([place])  C61     COMPLEX SIMULATION:  The patient presented today for evaluation for possible prostate seed implant. He was brought to the radiation planning suite and placed supine on the CT couch. A 3-dimensional image study set was obtained in upload to the planning computer. There, on each axial slice, I contoured the prostate gland. Then, using three-dimensional radiation planning tools I reconstructed the prostate in view of the structures from the transperineal needle pathway to assess for possible pubic arch interference. In doing so, I did not appreciate any pubic arch interference. Also, the patient's prostate volume was estimated based on the drawn structure. The volume was 50 cc.  Given the pubic arch appearance and prostate volume, patient remains a good candidate to proceed with prostate seed implant. Today, he freely provided informed written consent to proceed.    PLAN: The patient will undergo prostate seed implant.   ________________________________  Sheral Apley. Tammi Klippel, M.D.

## 2020-03-26 DIAGNOSIS — C61 Malignant neoplasm of prostate: Secondary | ICD-10-CM | POA: Diagnosis not present

## 2020-04-05 DIAGNOSIS — S61212A Laceration without foreign body of right middle finger without damage to nail, initial encounter: Secondary | ICD-10-CM | POA: Diagnosis not present

## 2020-04-06 ENCOUNTER — Encounter: Payer: Self-pay | Admitting: Medical Oncology

## 2020-04-06 NOTE — Progress Notes (Signed)
Patient states he lacerated finger yesterday, received sutures and was prescribed an antibiotic. He is asking if it is ok to take the antibiotic or will this interfere with his up coming seed implant. I told he it is very important to take the antibiotic to prevent infection and it will not interfere with seed implant. He voiced understanding.

## 2020-04-13 ENCOUNTER — Telehealth: Payer: Self-pay | Admitting: *Deleted

## 2020-04-13 NOTE — Telephone Encounter (Signed)
Called patient to remind of lab and Covid testing for 04-15-20, spoke with patient and he is aware of these appts.

## 2020-04-14 ENCOUNTER — Other Ambulatory Visit: Payer: Self-pay

## 2020-04-14 ENCOUNTER — Encounter (HOSPITAL_BASED_OUTPATIENT_CLINIC_OR_DEPARTMENT_OTHER): Payer: Self-pay | Admitting: Urology

## 2020-04-14 NOTE — Progress Notes (Signed)
Spoke w/ via phone for pre-op interview---pt Lab needs dos----  Has lab appt 04-15-2020 at 1220 pm for cbc, cmet, pt, ptt             Lab results------ekg 03-25-2020 epic/chart chest xray 03-25-2020 epic/chart COVID test ------04-15-2020 1330 pm Arrive at -------1100 04-19-2020 NPO after MN NO Solid Food.  Clear liquids from MN until---1000 am then npo Medications to take morning of surgery -----none Diabetic medication -----n/a Patient Special Instructions -----fleets enema am of surgery Pre-Op special Istructions -----none Patient verbalized understanding of instructions that were given at this phone interview. Patient denies shortness of breath, chest pain, fever, cough at this phone interview.

## 2020-04-15 ENCOUNTER — Other Ambulatory Visit (HOSPITAL_COMMUNITY)
Admission: RE | Admit: 2020-04-15 | Discharge: 2020-04-15 | Disposition: A | Discharge status: Home / Self Care | Payer: Medicare Other | Source: Ambulatory Visit | Attending: Urology | Admitting: Urology

## 2020-04-15 ENCOUNTER — Encounter (HOSPITAL_COMMUNITY)
Admission: RE | Admit: 2020-04-15 | Discharge: 2020-04-15 | Disposition: A | Discharge status: Home / Self Care | Payer: Medicare Other | Source: Ambulatory Visit | Attending: Urology | Admitting: Urology

## 2020-04-15 DIAGNOSIS — Z20822 Contact with and (suspected) exposure to covid-19: Secondary | ICD-10-CM | POA: Insufficient documentation

## 2020-04-15 DIAGNOSIS — Z01812 Encounter for preprocedural laboratory examination: Secondary | ICD-10-CM | POA: Insufficient documentation

## 2020-04-15 LAB — APTT: aPTT: 32 seconds (ref 24–36)

## 2020-04-15 LAB — CBC
HCT: 39.6 % (ref 39.0–52.0)
Hemoglobin: 13.3 g/dL (ref 13.0–17.0)
MCH: 32 pg (ref 26.0–34.0)
MCHC: 33.6 g/dL (ref 30.0–36.0)
MCV: 95.4 fL (ref 80.0–100.0)
Platelets: 127 10*3/uL — ABNORMAL LOW (ref 150–400)
RBC: 4.15 MIL/uL — ABNORMAL LOW (ref 4.22–5.81)
RDW: 13 % (ref 11.5–15.5)
WBC: 6.4 10*3/uL (ref 4.0–10.5)
nRBC: 0 % (ref 0.0–0.2)

## 2020-04-15 LAB — COMPREHENSIVE METABOLIC PANEL
ALT: 33 U/L (ref 0–44)
AST: 43 U/L — ABNORMAL HIGH (ref 15–41)
Albumin: 4.4 g/dL (ref 3.5–5.0)
Alkaline Phosphatase: 86 U/L (ref 38–126)
Anion gap: 11 (ref 5–15)
BUN: 13 mg/dL (ref 8–23)
CO2: 25 mmol/L (ref 22–32)
Calcium: 9.8 mg/dL (ref 8.9–10.3)
Chloride: 100 mmol/L (ref 98–111)
Creatinine, Ser: 0.93 mg/dL (ref 0.61–1.24)
GFR calc Af Amer: >60 mL/min (ref >60)
GFR calc non Af Amer: >60 mL/min (ref >60)
Glucose, Bld: 96 mg/dL (ref 70–99)
Potassium: 4.4 mmol/L (ref 3.5–5.1)
Sodium: 136 mmol/L (ref 135–145)
Total Bilirubin: 0.6 mg/dL (ref 0.3–1.2)
Total Protein: 7.4 g/dL (ref 6.5–8.1)

## 2020-04-15 LAB — SARS CORONAVIRUS 2 (TAT 6-24 HRS): SARS Coronavirus 2: NEGATIVE

## 2020-04-15 LAB — PROTIME-INR
INR: 0.9 (ref 0.8–1.2)
Prothrombin Time: 11.9 seconds (ref 11.4–15.2)

## 2020-04-16 ENCOUNTER — Telehealth: Payer: Self-pay | Admitting: *Deleted

## 2020-04-16 NOTE — Telephone Encounter (Signed)
CALLED PATIENT TO REMIND OF PROCEDURE FOR 04-19-20, SPOKE WITH PATIENT AND HE IS AWARE OF THIS PROCEDURE

## 2020-04-19 ENCOUNTER — Ambulatory Visit (HOSPITAL_BASED_OUTPATIENT_CLINIC_OR_DEPARTMENT_OTHER): Payer: Medicare Other | Admitting: Certified Registered"

## 2020-04-19 ENCOUNTER — Encounter (HOSPITAL_BASED_OUTPATIENT_CLINIC_OR_DEPARTMENT_OTHER): Payer: Self-pay | Admitting: Urology

## 2020-04-19 ENCOUNTER — Ambulatory Visit (HOSPITAL_COMMUNITY): Payer: Medicare Other

## 2020-04-19 ENCOUNTER — Ambulatory Visit (HOSPITAL_BASED_OUTPATIENT_CLINIC_OR_DEPARTMENT_OTHER)
Admission: RE | Admit: 2020-04-19 | Discharge: 2020-04-19 | Disposition: A | Discharge status: Home / Self Care | Payer: Medicare Other | Source: Ambulatory Visit | Attending: Urology | Admitting: Urology

## 2020-04-19 ENCOUNTER — Encounter (HOSPITAL_BASED_OUTPATIENT_CLINIC_OR_DEPARTMENT_OTHER)
Admission: RE | Disposition: A | Discharge status: Home / Self Care | Payer: Self-pay | Source: Ambulatory Visit | Attending: Urology

## 2020-04-19 DIAGNOSIS — C61 Malignant neoplasm of prostate: Secondary | ICD-10-CM | POA: Insufficient documentation

## 2020-04-19 DIAGNOSIS — Z79899 Other long term (current) drug therapy: Secondary | ICD-10-CM | POA: Diagnosis not present

## 2020-04-19 DIAGNOSIS — Z91030 Bee allergy status: Secondary | ICD-10-CM | POA: Diagnosis not present

## 2020-04-19 HISTORY — DX: Laceration without foreign body of unspecified finger without damage to nail, initial encounter: S61.219A

## 2020-04-19 HISTORY — PX: SPACE OAR INSTILLATION: SHX6769

## 2020-04-19 HISTORY — PX: RADIOACTIVE SEED IMPLANT: SHX5150

## 2020-04-19 HISTORY — DX: Unspecified glaucoma: H40.9

## 2020-04-19 HISTORY — DX: Unspecified open wound of unspecified finger without damage to nail, initial encounter: S61.209A

## 2020-04-19 SURGERY — INSERTION, RADIATION SOURCE, PROSTATE
Anesthesia: General | Site: Prostate

## 2020-04-19 MED ORDER — ACETAMINOPHEN 160 MG/5ML PO SOLN: 325.0000 mg | ORAL | Status: DC | PRN

## 2020-04-19 MED ORDER — FENTANYL CITRATE (PF) 100 MCG/2ML IJ SOLN
INTRAMUSCULAR | Status: AC
  Filled 2020-04-19: qty 2

## 2020-04-19 MED ORDER — DEXAMETHASONE SODIUM PHOSPHATE 10 MG/ML IJ SOLN
INTRAMUSCULAR | Status: DC | PRN
  Administered 2020-04-19: 10 mg via INTRAVENOUS

## 2020-04-19 MED ORDER — SODIUM CHLORIDE 0.9 % IV SOLN
INTRAVENOUS | Status: AC | PRN
  Administered 2020-04-19: 1000 mL

## 2020-04-19 MED ORDER — FENTANYL CITRATE (PF) 100 MCG/2ML IJ SOLN
INTRAMUSCULAR | Status: DC | PRN
Start: 2020-04-19 — End: 2020-04-19
  Administered 2020-04-19: 50 ug via INTRAVENOUS
  Administered 2020-04-19 (×2): 25 ug via INTRAVENOUS

## 2020-04-19 MED ORDER — PHENYLEPHRINE 40 MCG/ML (10ML) SYRINGE FOR IV PUSH (FOR BLOOD PRESSURE SUPPORT)
PREFILLED_SYRINGE | INTRAVENOUS | Status: AC
  Filled 2020-04-19: qty 10

## 2020-04-19 MED ORDER — LIDOCAINE 2% (20 MG/ML) 5 ML SYRINGE
INTRAMUSCULAR | Status: AC
  Filled 2020-04-19: qty 5

## 2020-04-19 MED ORDER — PROPOFOL 10 MG/ML IV BOLUS
INTRAVENOUS | Status: AC
  Filled 2020-04-19: qty 20

## 2020-04-19 MED ORDER — CIPROFLOXACIN IN D5W 400 MG/200ML IV SOLN
INTRAVENOUS | Status: AC
  Filled 2020-04-19: qty 200

## 2020-04-19 MED ORDER — OXYCODONE HCL 5 MG/5ML PO SOLN: 5.0000 mg | Freq: Once | ORAL | Status: DC | PRN

## 2020-04-19 MED ORDER — OXYCODONE-ACETAMINOPHEN 5-325 MG PO TABS: 1.0000 | ORAL_TABLET | ORAL | 0 refills | Status: AC | PRN

## 2020-04-19 MED ORDER — ONDANSETRON HCL 4 MG/2ML IJ SOLN
INTRAMUSCULAR | Status: DC | PRN
  Administered 2020-04-19: 4 mg via INTRAVENOUS

## 2020-04-19 MED ORDER — LIDOCAINE 2% (20 MG/ML) 5 ML SYRINGE
INTRAMUSCULAR | Status: DC | PRN
  Administered 2020-04-19: 80 mg via INTRAVENOUS

## 2020-04-19 MED ORDER — CIPROFLOXACIN IN D5W 400 MG/200ML IV SOLN
400.0000 mg | INTRAVENOUS | Status: AC
  Administered 2020-04-19: 400 mg via INTRAVENOUS

## 2020-04-19 MED ORDER — OXYCODONE HCL 5 MG PO TABS: 5.0000 mg | ORAL_TABLET | Freq: Once | ORAL | Status: DC | PRN

## 2020-04-19 MED ORDER — ACETAMINOPHEN 325 MG PO TABS: 325.0000 mg | ORAL_TABLET | ORAL | Status: DC | PRN

## 2020-04-19 MED ORDER — DEXAMETHASONE SODIUM PHOSPHATE 10 MG/ML IJ SOLN
INTRAMUSCULAR | Status: AC
  Filled 2020-04-19: qty 1

## 2020-04-19 MED ORDER — ONDANSETRON HCL 4 MG/2ML IJ SOLN
INTRAMUSCULAR | Status: AC
  Filled 2020-04-19: qty 2

## 2020-04-19 MED ORDER — FLEET ENEMA 7-19 GM/118ML RE ENEM: 1.0000 | ENEMA | Freq: Once | RECTAL | Status: DC

## 2020-04-19 MED ORDER — STERILE WATER FOR IRRIGATION IR SOLN
Status: DC | PRN
  Administered 2020-04-19: 5 mL
  Administered 2020-04-19: 500 mL

## 2020-04-19 MED ORDER — LACTATED RINGERS IV SOLN: INTRAVENOUS | Status: DC

## 2020-04-19 MED ORDER — HYDROMORPHONE HCL 1 MG/ML IJ SOLN: 0.2500 mg | INTRAMUSCULAR | Status: DC | PRN

## 2020-04-19 MED ORDER — SODIUM CHLORIDE (PF) 0.9 % IJ SOLN
INTRAMUSCULAR | Status: DC | PRN
  Administered 2020-04-19: 50 mL via INTRAVENOUS

## 2020-04-19 MED ORDER — SODIUM CHLORIDE (PF) 0.9 % IJ SOLN
INTRAMUSCULAR | Status: AC
  Filled 2020-04-19: qty 50

## 2020-04-19 MED ORDER — ONDANSETRON HCL 4 MG/2ML IJ SOLN: 4.0000 mg | Freq: Once | INTRAMUSCULAR | Status: DC | PRN

## 2020-04-19 MED ORDER — PHENYLEPHRINE 40 MCG/ML (10ML) SYRINGE FOR IV PUSH (FOR BLOOD PRESSURE SUPPORT)
PREFILLED_SYRINGE | INTRAVENOUS | Status: DC | PRN
  Administered 2020-04-19 (×2): 80 ug via INTRAVENOUS

## 2020-04-19 MED ORDER — IOHEXOL 300 MG/ML  SOLN
INTRAMUSCULAR | Status: DC | PRN
  Administered 2020-04-19: 5 mL

## 2020-04-19 MED ORDER — PROPOFOL 10 MG/ML IV BOLUS
INTRAVENOUS | Status: DC | PRN
  Administered 2020-04-19: 160 mg via INTRAVENOUS

## 2020-04-19 SURGICAL SUPPLY — 39 items
BAG DRN RND TRDRP ANRFLXCHMBR (UROLOGICAL SUPPLIES) ×2
BAG URINE DRAIN 2000ML AR STRL (UROLOGICAL SUPPLIES) ×6 IMPLANT
BLADE CLIPPER SENSICLIP SURGIC (BLADE) ×3 IMPLANT
CATH FOLEY 2WAY SLVR  5CC 16FR (CATHETERS) ×4
CATH FOLEY 2WAY SLVR 5CC 16FR (CATHETERS) ×2 IMPLANT
CATH ROBINSON RED A/P 16FR (CATHETERS) ×3 IMPLANT
CATH ROBINSON RED A/P 20FR (CATHETERS) ×3 IMPLANT
CLOTH BEACON ORANGE TIMEOUT ST (SAFETY) ×3 IMPLANT
CNTNR URN SCR LID CUP LEK RST (MISCELLANEOUS) ×2 IMPLANT
CONT SPEC 4OZ STRL OR WHT (MISCELLANEOUS) ×6
COVER BACK TABLE 60X90IN (DRAPES) ×3 IMPLANT
COVER MAYO STAND STRL (DRAPES) ×3 IMPLANT
DRAPE C-ARM 35X43 STRL (DRAPES) ×3 IMPLANT
DRSG TEGADERM 4X4.75 (GAUZE/BANDAGES/DRESSINGS) ×3 IMPLANT
DRSG TEGADERM 8X12 (GAUZE/BANDAGES/DRESSINGS) ×6 IMPLANT
GAUZE SPONGE 4X4 12PLY STRL (GAUZE/BANDAGES/DRESSINGS) ×3 IMPLANT
GLOVE BIO SURGEON STRL SZ 6.5 (GLOVE) ×2 IMPLANT
GLOVE BIO SURGEON STRL SZ7.5 (GLOVE) ×6 IMPLANT
GLOVE BIO SURGEON STRL SZ8 (GLOVE) ×3 IMPLANT
GLOVE BIO SURGEONS STRL SZ 6.5 (GLOVE) ×1
GLOVE SURG ORTHO 8.5 STRL (GLOVE) ×3 IMPLANT
GLOVE SURG SS PI 6.5 STRL IVOR (GLOVE) IMPLANT
GOWN STRL REUS W/TWL LRG LVL3 (GOWN DISPOSABLE) ×3 IMPLANT
HOLDER FOLEY CATH W/STRAP (MISCELLANEOUS) ×3 IMPLANT
I-Seed AgX100 ×165 IMPLANT
IMPL SPACEOAR VUE SYSTEM (Spacer) ×1 IMPLANT
IMPLANT SPACEOAR VUE SYSTEM (Spacer) ×3 IMPLANT
IV NS 1000ML (IV SOLUTION) ×3
IV NS 1000ML BAXH (IV SOLUTION) ×1 IMPLANT
KIT TURNOVER CYSTO (KITS) ×3 IMPLANT
MARKER SKIN DUAL TIP RULER LAB (MISCELLANEOUS) ×3 IMPLANT
PACK CYSTO (CUSTOM PROCEDURE TRAY) ×3 IMPLANT
SURGILUBE 2OZ TUBE FLIPTOP (MISCELLANEOUS) ×3 IMPLANT
SUT BONE WAX W31G (SUTURE) IMPLANT
SYR 10ML LL (SYRINGE) ×3 IMPLANT
SYR 20ML LL LF (SYRINGE) IMPLANT
TOWEL OR 17X26 10 PK STRL BLUE (TOWEL DISPOSABLE) ×3 IMPLANT
UNDERPAD 30X36 HEAVY ABSORB (UNDERPADS AND DIAPERS) ×6 IMPLANT
WATER STERILE IRR 500ML POUR (IV SOLUTION) ×3 IMPLANT

## 2020-04-19 NOTE — H&P (Signed)
DIAGNOSIS: 74 y.o. gentleman with Stage T1c adenocarcinoma of the prostate with Gleason score of 4+5, and PSA of 11.       ICD-10-CM   1. Malignant neoplasm of prostate ([place])  C61       HISTORY OF PRESENT ILLNESS: Mitchell Curtis is a 74 y.o. male with a diagnosis of prostate cancer. He was initially referred to Dr. Karsten Curtis in 04/2005 when his PSA increased from 2.6 to 4.0. Biopsy performed in 06/2005 was benign with a minute focus of atypical glands. Following the procedure, his PSA remained stable and his DRE remained benign until 2017. His PSA rose to 7.84 in 11/2015, prompting repeat biopsy on 12/23/2015. Pathology showed one core (out of 12) with Gleason 3+3 prostate cancer. He elected to proceed in active surveillance at that time. Surveillance prostate MRI performed on 01/24/2018 showed no evidence of high-grade prostate carcinoma.   His PSA reached 10.3 in 07/2019 and 11 in 11/2019. This prompted a repeat prostate MRI on 12/25/2019 which showed a 19 mm, PI-RADS 5 peripheral zone nodule in the left lateral mid gland and base with probable extracapsular extension and left seminal vesicle involvement but no evidence of pelvic lymphadenopathy or bone metastases.   The patient proceeded to MRI fusion biopsy of the prostate on 01/13/2020.  The prostate volume measured 51.74 cc.  Out of 17 core biopsies, 3 were positive.  All 12 of the standard core biopsies, as well as 2 left seminal vesicle samples, were benign with one core of HG/PIN. The maximum Gleason score was 4+5, and this was seen in all three ROI MRI lesion cores with perineural invasion.   He underwent staging scans on 02/03/2020. CT A/P and bone scan both showed no evidence of metastatic disease.   The patient reviewed the biopsy results with his urologist and he has kindly been referred today for discussion of potential radiation treatment options. He was started on ADT with a 27-month Eligard injection given on 02/04/2020 at the time of his last  follow-up visit with Dr. Karsten Curtis. With Dr. Simone Curtis retirement, he will continue his care with Dr. Milford Curtis.  History as above, presents now for I125 interstitial brachytherapy and Space Oars implant   PREVIOUS RADIATION THERAPY: No   PAST MEDICAL HISTORY:  Past Medical History: Diagnosis Date . Prostate cancer ([place])        PAST SURGICAL HISTORY:History reviewed. No pertinent surgical history.   FAMILY HISTORY: History reviewed. No pertinent family history.   SOCIAL HISTORY:  Social History   Socioeconomic History . Marital status: Married     Spouse name: Not on file . Number of children: Not on file . Years of education: Not on file . Highest education level: Not on file Occupational History     Comment: retired Tobacco Use . Smoking status: Never Smoker . Smokeless tobacco: Never Used Vaping Use . Vaping Use: Never used Substance and Sexual Activity . Alcohol use: Not Currently . Drug use: Never . Sexual activity: Yes Other Topics Concern . Not on file Social History Narrative . Not on file   Social Determinants of Health   Financial Resource Strain:  . Difficulty of Paying Living Expenses:  Food Insecurity:  . Worried About Charity fundraiser in the Last Year:  . Arboriculturist in the Last Year:  Transportation Needs:  . Film/video editor (Medical):  Marland Kitchen Lack of Transportation (Non-Medical):  Physical Activity:  . Days of Exercise per Week:  . Minutes of Exercise per Session:  Stress:  . Feeling of Stress :  Social Connections:  . Frequency of Communication with Friends and Family:  . Frequency of Social Gatherings with Friends and Family:  . Attends Religious Services:  . Active Member of Clubs or Organizations:  . Attends Archivist Meetings:  Marland Kitchen Marital Status:  Intimate Partner Violence:  . Fear of Current or Ex-Partner:  . Emotionally Abused:  Marland Kitchen Physically Abused:  . Sexually Abused:      ALLERGIES: Bee venom   MEDICATIONS:   Current Outpatient Medications Medication Sig Dispense Refill . triamcinolone cream (KENALOG) 0.1 % Apply topically.     . latanoprost (XALATAN) 0.005 % ophthalmic solution       . leuprolide, 6 Month, (LEUPROLIDE ACETATE, 6 MONTH,) 45 MG injection Inject into the skin.     Marland Kitchen moxifloxacin (VIGAMOX) 0.5 % ophthalmic solution       . prednisoLONE acetate (PRED FORTE) 1 % ophthalmic suspension         No current facility-administered medications for this encounter.     REVIEW OF SYSTEMS:  On review of systems, the patient reports that he is doing well overall. He denies any chest pain, shortness of breath, cough, fevers, chills, night sweats, unintended weight changes. He denies any bowel disturbances, and denies abdominal pain, nausea or vomiting. He denies any new musculoskeletal or joint aches or pains. His IPSS was 7, indicating mild urinary symptoms. His SHIM was 1, indicating he does have severe erectile dysfunction but reports that he is not active and this is not of high priority. A complete review of systems is obtained and is otherwise negative.   PHYSICAL EXAM:  Wt Readings from Last 3 Encounters: No data found for Wt   Temp Readings from Last 3 Encounters: No data found for Temp   BP Readings from Last 3 Encounters: No data found for BP   Pulse Readings from Last 3 Encounters: No data found for Pulse    /10   In general this is a well appearing Caucasian male in no acute distress. He is alert and oriented x4 and appropriate throughout the examination. HEENT reveals that the patient is normocephalic, atraumatic. EOMs are intact. PERRLA. Skin is intact without any evidence of gross lesions. Cardiopulmonary assessment is negative for acute distress and he exhibits normal effort. The abdomen is soft, non tender, non distended. Lower extremities are negative for pretibial pitting edema, deep calf tenderness, cyanosis or clubbing.     KPS = 100   100 - Normal; no complaints; no  evidence of disease. 90   - Able to carry on normal activity; minor signs or symptoms of disease. 80   - Normal activity with effort; some signs or symptoms of disease. 88   - Cares for self; unable to carry on normal activity or to do active work. 60   - Requires occasional assistance, but is able to care for most of his personal needs. 50   - Requires considerable assistance and frequent medical care. 86   - Disabled; requires special care and assistance. 63   - Severely disabled; hospital admission is indicated although death not imminent. 76   - Very sick; hospital admission necessary; active supportive treatment necessary. 10   - Moribund; fatal processes progressing rapidly. 0     - Dead   Karnofsky DA, Abelmann WH, Craver LS and Burchenal [facility] [phone]) The use of the nitrogen mustards in the palliative treatment of carcinoma: with particular reference to bronchogenic carcinoma Cancer  1 [id number]   LABORATORY DATA:  Recent Labs No results found for: WBC, HGB, HCT, MCV, PLT  Recent Labs No results found for: NA, K, CL, CO2  Recent Labs No results found for: ALT, AST, GGT, ALKPHOS, BILITOT    RADIOGRAPHY:  Imaging Results NM Bone Scan Whole Body   Result Date: 02/04/2020 CLINICAL DATA:  Prostate cancer. EXAM: NUCLEAR MEDICINE WHOLE BODY BONE SCAN TECHNIQUE: Whole body anterior and posterior images were obtained approximately 3 hours after intravenous injection of radiopharmaceutical. RADIOPHARMACEUTICALS:  20.1 mCi Technetium-44m MDP IV COMPARISON:  CT abdomen pelvis from same day. FINDINGS: There are no foci of increased or decreased radiotracer uptake to suggest osseous metastatic disease. Degenerative type uptake in both shoulders and knees. Normal physiologic activity is identified within the kidneys and urinary bladder. IMPRESSION: No evidence of osseous metastatic disease. Electronically Signed   By: Titus Dubin M.D.   On: 02/04/2020 08:06       IMPRESSION/PLAN: 1.         74  y.o. gentleman with Stage T1c adenocarcinoma of the prostate with Gleason Score of 4+5, and PSA of 11. We discussed the patient's workup and outlined the nature of prostate cancer in this setting. The patient's T stage, Gleason's score, and PSA put him into the high risk group. Accordingly, he is eligible for a variety of potential treatment options including prostatectomy or LT-ADT in combination with either 8 weeks of external radiation or 5 weeks of external radiation with an upfront brachytherapy boost. We discussed the available radiation techniques, and focused on the details and logistics of delivery. We discussed and outlined the risks, benefits, short and long-term effects associated with radiotherapy and compared and contrasted these with prostatectomy. We discussed the role of SpaceOAR in reducing the rectal toxicity associated with radiotherapy. We also detailed the role of ADT in the treatment of high risk prostate cancer and outlined the associated side effects that could be expected with this therapy. He was encouraged to ask questions that were answered to his stated satisfaction   At the conclusion of our conversation, the patient is interested in moving forward with long-term ADT in combination with an upfront brachytherapy boost followed by a 5 week course of daily external beam radiotherapy. He is interested in having SpaceOAR gel placement at the time of his seed boost procedure to reduce the rectal toxicities of radiotherapy. He has already started ADT with 6 month Eligard injection on 02/04/20. Therefore, we will move forward with coordinating the  brachytherapy boost and SpaceOAR gel insertion at the outpatient surgical center in early September 2021, approximately 2 months from the start of ADT. We will plan to see him back in the office approximately 2 weeks following his procedure and will proceed with CT simulation in preparation for beginning 5 weeks of prostate external beam  radiotherapy shortly thereafter. He appears to have a good understanding of his disease and our treatment recommendations which are of curative intent and is comfortable with and is in agreement with the stated plan.    We enjoyed meeting him and his wife today and look forward to participating in the care of this nice gentleman. We will share our findings with Dr. Milford Curtis and move forward with coordinating his treatment. Addendum: Risks/benefits of procedure discussed in detail with patient, desires to proceed with Brachytherapy/ Space Oars implant

## 2020-04-19 NOTE — Transfer of Care (Signed)
Immediate Anesthesia Transfer of Care Note  Patient: Mathhew Buysse  Procedure(s) Performed: RADIOACTIVE SEED IMPLANT/BRACHYTHERAPY IMPLANT, 55 seeds (N/A Prostate) SPACE OAR INSTILLATION (N/A Prostate)  Patient Location: PACU  Anesthesia Type:General  Level of Consciousness: awake, alert  and oriented  Airway & Oxygen Therapy: Patient Spontanous Breathing and Patient connected to nasal cannula oxygen  Post-op Assessment: Report given to RN and Post -op Vital signs reviewed and stable  Post vital signs: Reviewed and stable  Last Vitals:  Vitals Value Taken Time  BP 133/83 04/19/20 1445  Temp    Pulse 76 04/19/20 1447  Resp 19 04/19/20 1447  SpO2 100 % 04/19/20 1447  Vitals shown include unvalidated device data.  Last Pain:  Vitals:   04/19/20 1124  TempSrc: Oral  PainSc: 0-No pain      Patients Stated Pain Goal: 5 (93/55/21 7471)  Complications: No complications documented.

## 2020-04-19 NOTE — Anesthesia Preprocedure Evaluation (Addendum)
Anesthesia Evaluation  Patient identified by MRN, date of birth, ID band  Reviewed: Patient's Chart, lab work & pertinent test results  Airway Mallampati: II  TM Distance: >3 FB Neck ROM: Full    Dental  (+) Teeth Intact, Caps   Pulmonary neg pulmonary ROS,    Pulmonary exam normal        Cardiovascular negative cardio ROS   Rhythm:Regular Rate:Normal     Neuro/Psych negative neurological ROS  negative psych ROS   GI/Hepatic negative GI ROS, Neg liver ROS,   Endo/Other  negative endocrine ROS  Renal/GU negative Renal ROS   Prostate cancer    Musculoskeletal negative musculoskeletal ROS (+)   Abdominal Normal abdominal exam  (+)  Abdomen: soft. Bowel sounds: normal.  Peds negative pediatric ROS (+)  Hematology negative hematology ROS (+)   Anesthesia Other Findings   Reproductive/Obstetrics negative OB ROS                            Anesthesia Physical Anesthesia Plan  ASA: II  Anesthesia Plan: General   Post-op Pain Management:    Induction: Intravenous  PONV Risk Score and Plan: 2 and Ondansetron and Dexamethasone  Airway Management Planned: Mask and LMA  Additional Equipment: None  Intra-op Plan:   Post-operative Plan: Extubation in OR  Informed Consent: I have reviewed the patients History and Physical, chart, labs and discussed the procedure including the risks, benefits and alternatives for the proposed anesthesia with the patient or authorized representative who has indicated his/her understanding and acceptance.       Plan Discussed with: CRNA and Surgeon  Anesthesia Plan Comments: (Lab Results      Component                Value               Date                      WBC                      6.4                 04/15/2020                HGB                      13.3                04/15/2020                HCT                      39.6                 04/15/2020                MCV                      95.4                04/15/2020                PLT                      127 (L)  04/15/2020           Covid-19 Nucleic Acid Test Results Lab Results      Component                Value               Date                      [id number]              NEGATIVE            04/15/2020          )      Anesthesia Quick Evaluation

## 2020-04-19 NOTE — Interval H&P Note (Signed)
History and Physical Interval Note:  04/19/2020 12:39 PM  Mitchell Curtis  has presented today for surgery, with the diagnosis of PROSTATE CANCER.  The various methods of treatment have been discussed with the patient and family. After consideration of risks, benefits and other options for treatment, the patient has consented to  Procedure(s) with comments: RADIOACTIVE SEED IMPLANT/BRACHYTHERAPY IMPLANT (N/A) - 90 MINS SPACE OAR INSTILLATION (N/A) as a surgical intervention.  The patient's history has been reviewed, patient examined, no change in status, stable for surgery.  I have reviewed the patient's chart and labs.  Questions were answered to the patient's satisfaction.     Mitchell Curtis

## 2020-04-19 NOTE — Op Note (Signed)
Operative note  Preop diagnosis: Prostate cancer Postop diagnosis: Prostate cancer Procedure: Iodine 125 interstitial brachytherapy with SpaceOAR placement, cystoscopy Surgeon: Milford Cage Anesthesia: General Estimated blood loss: Minimal Operative findings 21 needles and 55 seeds implanted without difficulty.  SpaceOAR placed between the rectum and prostate without difficulty.  61 French Foley placed at the termination of the case  Operative report: After obtaining informed consent for the patient he was taken the major OR suite and placed under general anesthesia.  Placed in the dorsal lithotomy position and his genitalia and perineal area were prepped draped in usual sterile fashion.  Intraoperative radiation planning was performed by Dr. Tammi Klippel utilizing real-time ultrasound imaging.  Please see details of this and separate operative note.  After preoperative planning was performed needles were placed transperineally under ultrasound guidance from base to apex and a peripheral loading fashion.  Total of 21 needles were passed with 55 implants placed in a peripheral loading and urethral sparing fashion.  Total prescribed dose is 110 Gray.  Attention was directed towards placement of the SpaceOAR implant.  Utilizing injection needle transperineally approximately 2 cm above the anal verge the needle was directed in a 15 degree fashion under ultrasound guidance down to the obviates fascia.  Gentle insufflation was saline was performed to get into the space between Denonvoiller's fascia and the posterior aspect of the prostate.  The tip of the needle was confirmed on axial and transverse images to be in the correct position with insufflation of saline posterior and centrally located.  The SpaceOAR implant was then injected with good insufflation posterior to the prostate above did not obvious fashion.  The injection needle was removed.  Foley catheter was removed and cystoscopy was performed and no evidence  of seed implants were within the urethra or in the bladder.  58 French Foley catheter was then placed to drain the bladder and placed to gravity drain.  The procedure was terminated he was awakened from anesthesia and taken back to the recovery room in stable condition.  No immediate complication from the procedure.

## 2020-04-19 NOTE — Anesthesia Procedure Notes (Signed)
Procedure Name: LMA Insertion Date/Time: 04/19/2020 1:12 PM Performed by: Le Faulcon D, CRNA Pre-anesthesia Checklist: Patient identified, Emergency Drugs available, Suction available and Patient being monitored Patient Re-evaluated:Patient Re-evaluated prior to induction Oxygen Delivery Method: Circle system utilized Preoxygenation: Pre-oxygenation with 100% oxygen Induction Type: IV induction Ventilation: Mask ventilation without difficulty LMA: LMA inserted LMA Size: 4.0 Tube type: Oral Number of attempts: 1 Placement Confirmation: positive ETCO2 and breath sounds checked- equal and bilateral Tube secured with: Tape Dental Injury: Teeth and Oropharynx as per pre-operative assessment

## 2020-04-19 NOTE — Discharge Instructions (Signed)
Indwelling Urinary Catheter Care, Adult An indwelling urinary catheter is a thin tube that is put into your bladder. The tube helps to drain pee (urine) out of your body. The tube goes in through your urethra. Your urethra is where pee comes out of your body. Your pee will come out through the catheter, then it will go into a bag (drainage bag). Take good care of your catheter so it will work well. How to wear your catheter and bag Supplies needed  Sticky tape (adhesive tape) or a leg strap.  Alcohol wipe or soap and water (if you use tape).  A clean towel (if you use tape).  Large overnight bag.  Smaller bag (leg bag). Wearing your catheter Attach your catheter to your leg with tape or a leg strap.  Make sure the catheter is not pulled tight.  If a leg strap gets wet, take it off and put on a dry strap.  If you use tape to hold the bag on your leg: 1. Use an alcohol wipe or soap and water to wash your skin where the tape made it sticky before. 2. Use a clean towel to pat-dry that skin. 3. Use new tape to make the bag stay on your leg. Wearing your bags You should have been given a large overnight bag.  You may wear the overnight bag in the day or night.  Always have the overnight bag lower than your bladder.  Do not let the bag touch the floor.  Before you go to sleep, put a clean plastic bag in a wastebasket. Then hang the overnight bag inside the wastebasket. You should also have a smaller leg bag that fits under your clothes.  Always wear the leg bag below your knee.  Do not wear your leg bag at night. How to care for your skin and catheter Supplies needed  A clean washcloth.  Water and mild soap.  A clean towel. Caring for your skin and catheter      Clean the skin around your catheter every day: 1. Wash your hands with soap and water. 2. Wet a clean washcloth in warm water and mild soap. 3. Clean the skin around your urethra.  If you are  male:  Gently spread the folds of skin around your vagina (labia).  With the washcloth in your other hand, wipe the inner side of your labia on each side. Wipe from front to back.  If you are male:  Pull back any skin that covers the end of your penis (foreskin).  With the washcloth in your other hand, wipe your penis in small circles. Start wiping at the tip of your penis, then move away from the catheter.  Move the foreskin back in place, if needed. 4. With your free hand, hold the catheter close to where it goes into your body.  Keep holding the catheter during cleaning so it does not get pulled out. 5. With the washcloth in your other hand, clean the catheter.  Only wipe downward on the catheter.  Do not wipe upward toward your body. Doing this may push germs into your urethra and cause infection. 6. Use a clean towel to pat-dry the catheter and the skin around it. Make sure to wipe off all soap. 7. Wash your hands with soap and water.  Shower every day. Do not take baths.  Do not use cream, ointment, or lotion on the area where the catheter goes into your body, unless your doctor tells you   to.  Do not use powders, sprays, or lotions on your genital area.  Check your skin around the catheter every day for signs of infection. Check for: ? Redness, swelling, or pain. ? Fluid or blood. ? Warmth. ? Pus or a bad smell. How to empty the bag Supplies needed  Rubbing alcohol.  Gauze pad or cotton ball.  Tape or a leg strap. Emptying the bag Pour the pee out of your bag when it is ?- full, or at least 2-3 times a day. Do this for your overnight bag and your leg bag. 1. Wash your hands with soap and water. 2. Separate (detach) the bag from your leg. 3. Hold the bag over the toilet or a clean pail. Keep the bag lower than your hips and bladder. This is so the pee (urine) does not go back into the tube. 4. Open the pour spout. It is at the bottom of the bag. 5. Empty the  pee into the toilet or pail. Do not let the pour spout touch any surface. 6. Put rubbing alcohol on a gauze pad or cotton ball. 7. Use the gauze pad or cotton ball to clean the pour spout. 8. Close the pour spout. 9. Attach the bag to your leg with tape or a leg strap. 10. Wash your hands with soap and water. Follow instructions for cleaning the drainage bag:  From the product maker.  As told by your doctor. How to change the bag Supplies needed  Alcohol wipes.  A clean bag.  Tape or a leg strap. Changing the bag Replace your bag when it starts to leak, smell bad, or look dirty. 1. Wash your hands with soap and water. 2. Separate the dirty bag from your leg. 3. Pinch the catheter with your fingers so that pee does not spill out. 4. Separate the catheter tube from the bag tube where these tubes connect (at the connection valve). Do not let the tubes touch any surface. 5. Clean the end of the catheter tube with an alcohol wipe. Use a different alcohol wipe to clean the end of the bag tube. 6. Connect the catheter tube to the tube of the clean bag. 7. Attach the clean bag to your leg with tape or a leg strap. Do not make the bag tight on your leg. 8. Wash your hands with soap and water. General rules   Never pull on your catheter. Never try to take it out. Doing that can hurt you.  Always wash your hands before and after you touch your catheter or bag. Use a mild, fragrance-free soap. If you do not have soap and water, use hand sanitizer.  Always make sure there are no twists or bends (kinks) in the catheter tube.  Always make sure there are no leaks in the catheter or bag.  Drink enough fluid to keep your pee pale yellow.  Do not take baths, swim, or use a hot tub.  If you are male, wipe from front to back after you poop (have a bowel movement). Contact a doctor if:  Your pee is cloudy.  Your pee smells worse than usual.  Your catheter gets clogged.  Your catheter  leaks.  Your bladder feels full. Get help right away if:  You have redness, swelling, or pain where the catheter goes into your body.  You have fluid, blood, pus, or a bad smell coming from the area where the catheter goes into your body.  Your skin feels warm where   the catheter goes into your body.  You have a fever.  You have pain in your: ? Belly (abdomen). ? Legs. ? Lower back. ? Bladder.  You see blood in the catheter.  Your pee is pink or red.  You feel sick to your stomach (nauseous).  You throw up (vomit).  You have chills.  Your pee is not draining into the bag.  Your catheter gets pulled out. Summary  An indwelling urinary catheter is a thin tube that is placed into the bladder to help drain pee (urine) out of the body.  The catheter is placed into the part of the body that drains pee from the bladder (urethra).  Taking good care of your catheter will keep it working properly and help prevent problems.  Always wash your hands before and after touching your catheter or bag.  Never pull on your catheter or try to take it out. This information is not intended to replace advice given to you by your health care provider. Make sure you discuss any questions you have with your health care provider. Document Revised: 11/15/2018 Document Reviewed: 03/09/2017 Elsevier Patient Education  [place] Instructions  Activity: Get plenty of rest for the remainder of the day. A responsible adult should stay with you for 24 hours following the procedure.  For the next 24 hours, DO NOT: -Drive a car -Paediatric nurse -Drink alcoholic beverages -Take any medication unless instructed by your physician -Make any legal decisions or sign important papers.  Meals: Start with liquid foods such as gelatin or soup. Progress to regular foods as tolerated. Avoid greasy, spicy, heavy foods. If nausea and/or vomiting occur, drink only clear  liquids until the nausea and/or vomiting subsides. Call your physician if vomiting continues.  Special Instructions/Symptoms: Your throat may feel dry or sore from the anesthesia or the breathing tube placed in your throat during surgery. If this causes discomfort, gargle with warm salt water. The discomfort should disappear within 24 hours.  If you had a scopolamine patch placed behind your ear for the management of post- operative nausea and/or vomiting:  1. The medication in the patch is effective for 72 hours, after which it should be removed.  Wrap patch in a tissue and discard in the trash. Wash hands thoroughly with soap and water. 2. You may remove the patch earlier than 72 hours if you experience unpleasant side effects which may include dry mouth, dizziness or visual disturbances. 3. Avoid touching the patch. Wash your hands with soap and water after contact with the patch.    Brachytherapy for Prostate Cancer, Care After  This sheet gives you information about how to care for yourself after your procedure. Your health care provider may also give you more specific instructions. If you have problems or questions, contact your health care provider. What can I expect after the procedure? After the procedure, it is common to have:  Trouble passing urine.  Blood in the urine or semen.  Constipation.  Frequent feeling of an urgent need to urinate.  Bruising, swelling, and tenderness of the area behind the scrotum (perineum).  Bloating and gas.  Fatigue.  Burning or pain in the rectum.  Problems getting or keeping an erection (erectile dysfunction).  Nausea. Follow these instructions at home: Managing pain, stiffness, and swelling  If directed, apply ice to the affected area: ? Put ice in a plastic bag. ? Place a towel between your skin and the bag. ? Leave  the ice on for 20 minutes, 2-3 times a day.  Try not to sit directly on the area behind the scrotum. A soft  cushion can help with discomfort. Activity  Do not drive for 24 hours if you were given a medicine to help you relax (sedative).  Do not drive or use heavy machinery while taking prescription pain medicine.  Rest as told by your health care provider.  Most people can return to normal activities a few days or weeks after the procedure. Ask your health care provider what activities are safe for you. Eating and drinking  Drink enough fluid to keep your urine clear or pale yellow.  Eat a healthy, balanced diet. This includes lean proteins, whole grains, and plenty of fruits and vegetables. General instructions  Take over-the-counter and prescription medicines only as told by your health care provider.  Keep all follow-up visits as told by your health care provider. This is important. You may still need additional treatment.  Do not take baths, swim, or use a hot tub until your health care provider approves. Shower and wash the area behind the scrotum gently.  Do not have sex for one week after the treatment, or until your health care provider approves.  If you have permanent, low-dose brachytherapy implants: ? Limit close contact with children and pregnant women for 2 months or as told by your health care provider. This is important because of the radiation that is still active in the prostate. ? You may set off radioactive sensors, such as airport screenings. Ask your health care provider for a document that explains your treatment. ? You may be instructed to use a condom during sex for the first 2 months after low-dose brachytherapy. Contact a health care provider if:  You have a fever or chills.  You do not have a bowel movement for 3-4 days after the procedure.  You have diarrhea for 3-4 days after the procedure.  You develop any new symptoms, such as problems with urinating or erectile dysfunction.  You have abdomen (abdominal) pain.  You have more blood in your urine. Get  help right away if:  You cannot urinate.  There is excessive bleeding from your rectum.  You have unusual drainage coming from your rectum.  You have severe pain in the treated area that does not go away with pain medicine.  You have severe nausea or vomiting. Summary  If you have permanent, low-dose brachytherapy implants, limit close contact with children and pregnant women for 2 months or as told by your health care provider. This is important because of the radiation that is still active in the prostate.  Talk with your health care provider about your risk of brachytherapy side effects, such as erectile dysfunction or urinary problems. Your health care provider will be able to recommend possible treatment options.  Keep all follow-up visits as told by your health care provider. This is important. You may need additional treatment. This information is not intended to replace advice given to you by your health care provider. Make sure you discuss any questions you have with your health care provider. Document Revised: 07/06/2017 Document Reviewed: 08/25/2016 Elsevier Patient Education  [place].

## 2020-04-19 NOTE — Anesthesia Postprocedure Evaluation (Signed)
Anesthesia Post Note  Patient: Mitchell Curtis  Procedure(s) Performed: RADIOACTIVE SEED IMPLANT/BRACHYTHERAPY IMPLANT, 55 seeds (N/A Prostate) SPACE OAR INSTILLATION (N/A Prostate)     Patient location during evaluation: PACU Anesthesia Type: General Level of consciousness: awake and alert Pain management: pain level controlled Vital Signs Assessment: post-procedure vital signs reviewed and stable Respiratory status: spontaneous breathing, nonlabored ventilation, respiratory function stable and patient connected to nasal cannula oxygen Cardiovascular status: blood pressure returned to baseline and stable Postop Assessment: no apparent nausea or vomiting Anesthetic complications: no   No complications documented.  Last Vitals:  Vitals:   04/19/20 1515 04/19/20 1545  BP: (!) 144/82 (!) 145/88  Pulse: 69 70  Resp: 16 16  Temp:  36.7 C  SpO2: 99% 99%    Last Pain:  Vitals:   04/19/20 1545  TempSrc:   PainSc: 0-No pain                 Belenda Cruise P Khaila Velarde

## 2020-04-20 ENCOUNTER — Encounter (HOSPITAL_BASED_OUTPATIENT_CLINIC_OR_DEPARTMENT_OTHER): Payer: Self-pay | Admitting: Urology

## 2020-04-27 NOTE — Progress Notes (Signed)
  Radiation Oncology         [phone] ________________________________  Name: Mitchell Curtis MRN: [id number]  Date: 04/27/2020  DOB: 1946-05-04       Prostate Seed Implant  NO:TRRN, Dwyane Luo, MD  No ref. provider found  DIAGNOSIS:  Oncology History  Malignant neoplasm of prostate ([place])  01/14/2020 Cancer Staging   Staging form: Prostate, AJCC 8th Edition - Clinical stage from 01/14/2020: Stage IIIC (cT1c, cN0, cM0, PSA: 11, Grade Group: 5) - Signed by Freeman Caldron, PA-C on 02/27/2020   02/27/2020 Initial Diagnosis   Malignant neoplasm of prostate ([place])     No diagnosis found.  PROCEDURE: Insertion of radioactive I-125 seeds into the prostate gland.  RADIATION DOSE: 110 Gy, boost therapy.  TECHNIQUE: Janet Decesare was brought to the operating room with the urologist. He was placed in the dorsolithotomy position. He was catheterized and a rectal tube was inserted. The perineum was shaved, prepped and draped. The ultrasound probe was then introduced into the rectum to see the prostate gland.  TREATMENT DEVICE: A needle grid was attached to the ultrasound probe stand and anchor needles were placed.  3D PLANNING: The prostate was imaged in 3D using a sagittal sweep of the prostate probe. These images were transferred to the planning computer. There, the prostate, urethra and rectum were defined on each axial reconstructed image. Then, the software created an optimized 3D plan and a few seed positions were adjusted. The quality of the plan was reviewed using [facility] information for the target and the following two organs at risk:  Urethra and Rectum.  Then the accepted plan was printed and handed off to the radiation therapist.  Under my supervision, the custom loading of the seeds and spacers was carried out and loaded into sealed vicryl sleeves.  These pre-loaded needles were then placed into the needle holder.Marland Kitchen  PROSTATE VOLUME STUDY:  Using transrectal ultrasound the volume of the  prostate was verified to be 41.6 cc.  SPECIAL TREATMENT PROCEDURE/SUPERVISION AND HANDLING: The pre-loaded needles were then delivered under sagittal guidance. A total of 21 needles were used to deposit 55 seeds in the prostate gland. The individual seed activity was 0.429 mCi.  SpaceOAR:  Yes  COMPLEX SIMULATION: At the end of the procedure, an anterior radiograph of the pelvis was obtained to document seed positioning and count. Cystoscopy was performed to check the urethra and bladder.  MICRODOSIMETRY: At the end of the procedure, the patient was emitting 0.08 mR/hr at 1 meter. Accordingly, he was considered safe for hospital discharge.  PLAN: The patient will return to the radiation oncology clinic for post implant CT dosimetry in three weeks.   ________________________________  Sheral Apley Tammi Klippel, M.D.

## 2020-05-05 ENCOUNTER — Telehealth: Payer: Self-pay | Admitting: *Deleted

## 2020-05-05 NOTE — Telephone Encounter (Signed)
RETURNED PATIENT'S PHONE CALL, SPOKE WITH PATIENT. ?

## 2020-05-06 ENCOUNTER — Other Ambulatory Visit: Payer: Self-pay | Admitting: Urology

## 2020-05-06 ENCOUNTER — Encounter: Payer: Self-pay | Admitting: Medical Oncology

## 2020-05-06 DIAGNOSIS — Z23 Encounter for immunization: Secondary | ICD-10-CM | POA: Diagnosis not present

## 2020-05-06 MED ORDER — TAMSULOSIN HCL 0.4 MG PO CAPS: 0.4000 mg | ORAL_CAPSULE | Freq: Every day | ORAL | 1 refills | Status: DC

## 2020-05-06 NOTE — Progress Notes (Signed)
Spoke with patient to inform him Ashlyn, PA has prescribed Flomax. Per Ashlyn, take  one capsule after dinner in the evenings x 2-3 days, if no significant improvement, he can increase to 2 capsules in the evenings after dinner. I advised him of the risk of orthostatic hypotension so be careful when changing positions and NOT do so abruptly. I reminded him to avoid bladder irritants, continue with adequate hydration, mainly water, to help with the bladder irritation as well as keeping his bowels regular. He voiced understanding of the above and I asked him to call back if he does not see improvement.

## 2020-05-06 NOTE — Progress Notes (Signed)
Patient called stating he is doing well post seed implant except for urinary symptoms. He feels he has to constantly go urinate and is only urinating a small amount at a time. He also has post dribble. He feel uncomfortable leaving home and is asking if there is medication to help with this. I explained this is the side effect of the radiation and there are medications to help relax the bladder muscles and help to empty the bladder more completely. I will discuss with Ashlyn, PA and call him back with an update. He voiced understanding and was very appreciative of the call.

## 2020-05-10 ENCOUNTER — Telehealth: Payer: Self-pay | Admitting: *Deleted

## 2020-05-10 NOTE — Telephone Encounter (Signed)
CALLED PATIENT TO INFORM OF CHANGE TO SIM APPT. ON 05-12-20 - ARRIVAL TIME- 9:45 AM, SPOKE WITH PATIENT AND HE IS GOOD WITH THIS

## 2020-05-11 ENCOUNTER — Telehealth: Payer: Self-pay | Admitting: *Deleted

## 2020-05-11 NOTE — Telephone Encounter (Signed)
CALLED PATIENT TO REMIND OF SIM APPT. FOR 05-12-20 - ARRIVAL TIME- 9:45 AM, LVM FOR A RETURN CALL

## 2020-05-12 ENCOUNTER — Other Ambulatory Visit: Payer: Self-pay

## 2020-05-12 ENCOUNTER — Ambulatory Visit
Admission: RE | Admit: 2020-05-12 | Discharge: 2020-05-12 | Disposition: A | Discharge status: Home / Self Care | Payer: Medicare Other | Source: Ambulatory Visit | Attending: Radiation Oncology | Admitting: Radiation Oncology

## 2020-05-12 DIAGNOSIS — C61 Malignant neoplasm of prostate: Secondary | ICD-10-CM | POA: Insufficient documentation

## 2020-05-13 NOTE — Progress Notes (Signed)
  Radiation Oncology         [phone]) [phone] ________________________________  Name: Mitchell Curtis MRN: [id number]  Date: 05/12/2020  DOB: 1946-08-05  SIMULATION AND TREATMENT PLANNING NOTE    ICD-10-CM   1. Malignant neoplasm of prostate ([place])  C61     DIAGNOSIS:  74 y.o. gentleman with Stage T1c adenocarcinoma of the prostate with Gleason score of 4+5, and PSA of 11  NARRATIVE:  The patient was brought to the [facility].  Identity was confirmed.  All relevant records and images related to the planned course of therapy were reviewed.  The patient freely provided informed written consent to proceed with treatment after reviewing the details related to the planned course of therapy. The consent form was witnessed and verified by the simulation staff.  Then, the patient was set-up in a stable reproducible supine position for radiation therapy.  A vacuum lock pillow device was custom fabricated to position his legs in a reproducible immobilized position.  Then, I performed a urethrogram under sterile conditions to identify the prostatic apex.  CT images were obtained.  Surface markings were placed.  The CT images were loaded into the planning software.  Then the prostate target and avoidance structures including the rectum, bladder, bowel and hips were contoured.  Treatment planning then occurred.  The radiation prescription was entered and confirmed.  A total of one complex treatment devices were fabricated. I have requested : Intensity Modulated Radiotherapy (IMRT) is medically necessary for this case for the following reason:  Rectal sparing.Marland Kitchen  PLAN:  The patient will receive 45 Gy in 25 fractions of 1.8 Gy, to supplement an up-front prostate seed implant boost of 110 Gy to achieve a total nominal dose of 165 Gy.  ________________________________  Sheral Apley Tammi Klippel, M.D.

## 2020-05-19 DIAGNOSIS — C61 Malignant neoplasm of prostate: Secondary | ICD-10-CM | POA: Diagnosis not present

## 2020-05-24 ENCOUNTER — Ambulatory Visit
Admission: RE | Admit: 2020-05-24 | Discharge: 2020-05-24 | Disposition: A | Discharge status: Home / Self Care | Payer: Medicare Other | Source: Ambulatory Visit | Attending: Radiation Oncology | Admitting: Radiation Oncology

## 2020-05-24 ENCOUNTER — Encounter: Payer: Self-pay | Admitting: Medical Oncology

## 2020-05-24 ENCOUNTER — Other Ambulatory Visit: Payer: Self-pay

## 2020-05-24 DIAGNOSIS — C61 Malignant neoplasm of prostate: Secondary | ICD-10-CM | POA: Diagnosis not present

## 2020-05-25 ENCOUNTER — Ambulatory Visit
Admission: RE | Admit: 2020-05-25 | Discharge: 2020-05-25 | Disposition: A | Discharge status: Home / Self Care | Payer: Medicare Other | Source: Ambulatory Visit | Attending: Radiation Oncology | Admitting: Radiation Oncology

## 2020-05-25 DIAGNOSIS — C61 Malignant neoplasm of prostate: Secondary | ICD-10-CM | POA: Diagnosis not present

## 2020-05-26 ENCOUNTER — Ambulatory Visit
Admission: RE | Admit: 2020-05-26 | Discharge: 2020-05-26 | Disposition: A | Discharge status: Home / Self Care | Payer: Medicare Other | Source: Ambulatory Visit | Attending: Radiation Oncology | Admitting: Radiation Oncology

## 2020-05-26 DIAGNOSIS — C61 Malignant neoplasm of prostate: Secondary | ICD-10-CM | POA: Diagnosis not present

## 2020-05-27 ENCOUNTER — Ambulatory Visit
Admission: RE | Admit: 2020-05-27 | Discharge: 2020-05-27 | Disposition: A | Discharge status: Home / Self Care | Payer: Medicare Other | Source: Ambulatory Visit | Attending: Radiation Oncology | Admitting: Radiation Oncology

## 2020-05-27 DIAGNOSIS — C61 Malignant neoplasm of prostate: Secondary | ICD-10-CM | POA: Diagnosis not present

## 2020-05-28 ENCOUNTER — Ambulatory Visit
Admission: RE | Admit: 2020-05-28 | Discharge: 2020-05-28 | Disposition: A | Discharge status: Home / Self Care | Payer: Medicare Other | Source: Ambulatory Visit | Attending: Radiation Oncology | Admitting: Radiation Oncology

## 2020-05-28 ENCOUNTER — Other Ambulatory Visit: Payer: Self-pay | Admitting: Urology

## 2020-05-28 DIAGNOSIS — C61 Malignant neoplasm of prostate: Secondary | ICD-10-CM | POA: Diagnosis not present

## 2020-05-31 ENCOUNTER — Ambulatory Visit
Admission: RE | Admit: 2020-05-31 | Discharge: 2020-05-31 | Disposition: A | Discharge status: Home / Self Care | Payer: Medicare Other | Source: Ambulatory Visit | Attending: Radiation Oncology | Admitting: Radiation Oncology

## 2020-05-31 DIAGNOSIS — C61 Malignant neoplasm of prostate: Secondary | ICD-10-CM | POA: Diagnosis not present

## 2020-06-01 ENCOUNTER — Ambulatory Visit
Admission: RE | Admit: 2020-06-01 | Discharge: 2020-06-01 | Disposition: A | Discharge status: Home / Self Care | Payer: Medicare Other | Source: Ambulatory Visit | Attending: Radiation Oncology | Admitting: Radiation Oncology

## 2020-06-01 DIAGNOSIS — C61 Malignant neoplasm of prostate: Secondary | ICD-10-CM | POA: Diagnosis not present

## 2020-06-02 ENCOUNTER — Ambulatory Visit
Admission: RE | Admit: 2020-06-02 | Discharge: 2020-06-02 | Disposition: A | Discharge status: Home / Self Care | Payer: Medicare Other | Source: Ambulatory Visit | Attending: Radiation Oncology | Admitting: Radiation Oncology

## 2020-06-02 DIAGNOSIS — C61 Malignant neoplasm of prostate: Secondary | ICD-10-CM | POA: Diagnosis not present

## 2020-06-03 ENCOUNTER — Ambulatory Visit
Admission: RE | Admit: 2020-06-03 | Discharge: 2020-06-03 | Disposition: A | Discharge status: Home / Self Care | Payer: Medicare Other | Source: Ambulatory Visit | Attending: Radiation Oncology | Admitting: Radiation Oncology

## 2020-06-03 DIAGNOSIS — C61 Malignant neoplasm of prostate: Secondary | ICD-10-CM | POA: Diagnosis not present

## 2020-06-04 ENCOUNTER — Ambulatory Visit
Admission: RE | Admit: 2020-06-04 | Discharge: 2020-06-04 | Disposition: A | Discharge status: Home / Self Care | Payer: Medicare Other | Source: Ambulatory Visit | Attending: Radiation Oncology | Admitting: Radiation Oncology

## 2020-06-04 DIAGNOSIS — C61 Malignant neoplasm of prostate: Secondary | ICD-10-CM | POA: Diagnosis not present

## 2020-06-07 ENCOUNTER — Ambulatory Visit
Admission: RE | Admit: 2020-06-07 | Discharge: 2020-06-07 | Disposition: A | Discharge status: Home / Self Care | Payer: Medicare Other | Source: Ambulatory Visit | Attending: Radiation Oncology | Admitting: Radiation Oncology

## 2020-06-07 DIAGNOSIS — C61 Malignant neoplasm of prostate: Secondary | ICD-10-CM | POA: Diagnosis not present

## 2020-06-08 ENCOUNTER — Other Ambulatory Visit: Payer: Self-pay

## 2020-06-08 ENCOUNTER — Ambulatory Visit
Admission: RE | Admit: 2020-06-08 | Discharge: 2020-06-08 | Disposition: A | Discharge status: Home / Self Care | Payer: Medicare Other | Source: Ambulatory Visit | Attending: Radiation Oncology | Admitting: Radiation Oncology

## 2020-06-08 DIAGNOSIS — C61 Malignant neoplasm of prostate: Secondary | ICD-10-CM | POA: Diagnosis not present

## 2020-06-09 ENCOUNTER — Ambulatory Visit
Admission: RE | Admit: 2020-06-09 | Discharge: 2020-06-09 | Disposition: A | Discharge status: Home / Self Care | Payer: Medicare Other | Source: Ambulatory Visit | Attending: Radiation Oncology | Admitting: Radiation Oncology

## 2020-06-09 DIAGNOSIS — C61 Malignant neoplasm of prostate: Secondary | ICD-10-CM | POA: Diagnosis not present

## 2020-06-10 ENCOUNTER — Ambulatory Visit
Admission: RE | Admit: 2020-06-10 | Discharge: 2020-06-10 | Disposition: A | Discharge status: Home / Self Care | Payer: Medicare Other | Source: Ambulatory Visit | Attending: Radiation Oncology | Admitting: Radiation Oncology

## 2020-06-10 DIAGNOSIS — C61 Malignant neoplasm of prostate: Secondary | ICD-10-CM | POA: Diagnosis not present

## 2020-06-11 ENCOUNTER — Ambulatory Visit
Admission: RE | Admit: 2020-06-11 | Discharge: 2020-06-11 | Disposition: A | Discharge status: Home / Self Care | Payer: Medicare Other | Source: Ambulatory Visit | Attending: Radiation Oncology | Admitting: Radiation Oncology

## 2020-06-11 DIAGNOSIS — C61 Malignant neoplasm of prostate: Secondary | ICD-10-CM | POA: Diagnosis not present

## 2020-06-14 ENCOUNTER — Ambulatory Visit
Admission: RE | Admit: 2020-06-14 | Discharge: 2020-06-14 | Disposition: A | Discharge status: Home / Self Care | Payer: Medicare Other | Source: Ambulatory Visit | Attending: Radiation Oncology | Admitting: Radiation Oncology

## 2020-06-14 DIAGNOSIS — C61 Malignant neoplasm of prostate: Secondary | ICD-10-CM | POA: Diagnosis not present

## 2020-06-15 ENCOUNTER — Ambulatory Visit
Admission: RE | Admit: 2020-06-15 | Discharge: 2020-06-15 | Disposition: A | Discharge status: Home / Self Care | Payer: Medicare Other | Source: Ambulatory Visit | Attending: Radiation Oncology | Admitting: Radiation Oncology

## 2020-06-15 DIAGNOSIS — C61 Malignant neoplasm of prostate: Secondary | ICD-10-CM | POA: Diagnosis not present

## 2020-06-16 ENCOUNTER — Ambulatory Visit
Admission: RE | Admit: 2020-06-16 | Discharge: 2020-06-16 | Disposition: A | Discharge status: Home / Self Care | Payer: Medicare Other | Source: Ambulatory Visit | Attending: Radiation Oncology | Admitting: Radiation Oncology

## 2020-06-16 DIAGNOSIS — C61 Malignant neoplasm of prostate: Secondary | ICD-10-CM | POA: Diagnosis not present

## 2020-06-17 ENCOUNTER — Ambulatory Visit
Admission: RE | Admit: 2020-06-17 | Discharge: 2020-06-17 | Disposition: A | Discharge status: Home / Self Care | Payer: Medicare Other | Source: Ambulatory Visit | Attending: Radiation Oncology | Admitting: Radiation Oncology

## 2020-06-17 DIAGNOSIS — C61 Malignant neoplasm of prostate: Secondary | ICD-10-CM | POA: Diagnosis not present

## 2020-06-18 ENCOUNTER — Ambulatory Visit
Admission: RE | Admit: 2020-06-18 | Discharge: 2020-06-18 | Disposition: A | Discharge status: Home / Self Care | Payer: Medicare Other | Source: Ambulatory Visit | Attending: Radiation Oncology | Admitting: Radiation Oncology

## 2020-06-18 DIAGNOSIS — C61 Malignant neoplasm of prostate: Secondary | ICD-10-CM | POA: Diagnosis not present

## 2020-06-21 ENCOUNTER — Ambulatory Visit
Admission: RE | Admit: 2020-06-21 | Discharge: 2020-06-21 | Disposition: A | Discharge status: Home / Self Care | Payer: Medicare Other | Source: Ambulatory Visit | Attending: Radiation Oncology | Admitting: Radiation Oncology

## 2020-06-21 ENCOUNTER — Encounter: Payer: Self-pay | Admitting: Radiation Oncology

## 2020-06-21 DIAGNOSIS — C61 Malignant neoplasm of prostate: Secondary | ICD-10-CM | POA: Diagnosis not present

## 2020-06-22 ENCOUNTER — Ambulatory Visit
Admission: RE | Admit: 2020-06-22 | Discharge: 2020-06-22 | Disposition: A | Discharge status: Home / Self Care | Payer: Medicare Other | Source: Ambulatory Visit | Attending: Radiation Oncology | Admitting: Radiation Oncology

## 2020-06-22 DIAGNOSIS — C61 Malignant neoplasm of prostate: Secondary | ICD-10-CM | POA: Diagnosis not present

## 2020-06-23 ENCOUNTER — Ambulatory Visit
Admission: RE | Admit: 2020-06-23 | Discharge: 2020-06-23 | Disposition: A | Discharge status: Home / Self Care | Payer: Medicare Other | Source: Ambulatory Visit | Attending: Radiation Oncology | Admitting: Radiation Oncology

## 2020-06-23 DIAGNOSIS — C61 Malignant neoplasm of prostate: Secondary | ICD-10-CM | POA: Diagnosis not present

## 2020-06-24 ENCOUNTER — Ambulatory Visit
Admission: RE | Admit: 2020-06-24 | Discharge: 2020-06-24 | Disposition: A | Discharge status: Home / Self Care | Payer: Medicare Other | Source: Ambulatory Visit | Attending: Radiation Oncology | Admitting: Radiation Oncology

## 2020-06-24 DIAGNOSIS — C61 Malignant neoplasm of prostate: Secondary | ICD-10-CM | POA: Diagnosis not present

## 2020-06-25 ENCOUNTER — Encounter: Payer: Self-pay | Admitting: Urology

## 2020-06-25 ENCOUNTER — Ambulatory Visit
Admission: RE | Admit: 2020-06-25 | Discharge: 2020-06-25 | Disposition: A | Discharge status: Home / Self Care | Payer: Medicare Other | Source: Ambulatory Visit | Attending: Radiation Oncology | Admitting: Radiation Oncology

## 2020-06-25 ENCOUNTER — Encounter: Payer: Self-pay | Admitting: Medical Oncology

## 2020-06-25 ENCOUNTER — Other Ambulatory Visit: Payer: Self-pay | Admitting: Radiation Oncology

## 2020-06-25 DIAGNOSIS — C61 Malignant neoplasm of prostate: Secondary | ICD-10-CM | POA: Diagnosis not present

## 2020-06-25 MED ORDER — TAMSULOSIN HCL 0.4 MG PO CAPS: 0.8000 mg | ORAL_CAPSULE | Freq: Every day | ORAL | 5 refills | Status: AC

## 2020-07-09 DIAGNOSIS — H401132 Primary open-angle glaucoma, bilateral, moderate stage: Secondary | ICD-10-CM | POA: Diagnosis not present

## 2020-07-12 NOTE — Progress Notes (Signed)
  Radiation Oncology         [phone]) [phone] ________________________________  Name: Mitchell Curtis MRN: [id number]  Date: 06/21/2020  DOB: [birth year]  3D Planning Note   Prostate Brachytherapy Post-Implant Dosimetry  Diagnosis: 74 y.o. gentleman with Stage T1c adenocarcinoma of the prostate with Gleason score of 4+5, and PSA of 11  Narrative: On a previous date, [name] returned following prostate seed implantation for post implant planning. He underwent CT scan complex simulation to delineate the three-dimensional structures of the pelvis and demonstrate the radiation distribution.  Since that time, the seed localization, and complex isodose planning with dose volume histograms have now been completed.  Results:   Prostate Coverage - The dose of radiation delivered to the 90% or more of the prostate gland (D90) was 102.78% of the prescription dose. This exceeds our goal of greater than 90%. Rectal Sparing - The volume of rectal tissue receiving the prescription dose or higher was 0.0 cc. This falls under our thresholds tolerance of 1.0 cc.  Impression: The prostate seed implant appears to show adequate target coverage and appropriate rectal sparing.  Plan:  The patient will continue to follow with urology for ongoing PSA determinations. I would anticipate a high likelihood for local tumor control with minimal risk for rectal morbidity.  ________________________________  Sheral Apley Tammi Klippel, M.D.

## 2020-07-26 DIAGNOSIS — C61 Malignant neoplasm of prostate: Secondary | ICD-10-CM | POA: Diagnosis not present

## 2020-07-26 DIAGNOSIS — N401 Enlarged prostate with lower urinary tract symptoms: Secondary | ICD-10-CM | POA: Diagnosis not present

## 2020-07-26 DIAGNOSIS — R3914 Feeling of incomplete bladder emptying: Secondary | ICD-10-CM | POA: Diagnosis not present

## 2020-07-28 ENCOUNTER — Encounter: Payer: Self-pay | Admitting: Urology

## 2020-07-28 ENCOUNTER — Telehealth: Payer: Self-pay

## 2020-07-28 ENCOUNTER — Other Ambulatory Visit: Payer: Self-pay

## 2020-07-28 NOTE — Progress Notes (Signed)
Patient has a 1 month follow-up appointment for post radiation treatment. Patient states he recently had PSA levels drawn by the urologist and is awaiting results. Patient states that he is not emptying his bladder completely and returning to the bathroom less than an hour later. Patient states urine stream is strong and steady. Patient states nocturia 2-3 times per night. Patient denies dysuria or hematuria. Patient states that he is moving his bowels in small amounts and is having diarrhea 1 time per week. Patient states still having urgency and is unable to hold his urine. Patient states having leakage at night and is wearing leakage protection. Patient denies having fatigue. Patient is currently taking Flomax as directed.

## 2020-07-28 NOTE — Telephone Encounter (Signed)
Left message  To call back in regards to telephone visit with Freeman Caldron PA on 08/04/20 @ 10:00am. Called to review meaningful use, AUA and prostate questions. TM

## 2020-07-28 NOTE — Telephone Encounter (Signed)
Spoke with patient in regards to telephone follow-up with Freeman Caldron PA on 08/04/20 @10 :00am. Patient verbalized understanding of appointment date and time. Reviewed meaningful use, AUA and prostate questions. TM

## 2020-08-03 DIAGNOSIS — C61 Malignant neoplasm of prostate: Secondary | ICD-10-CM | POA: Diagnosis not present

## 2020-08-03 DIAGNOSIS — Z5111 Encounter for antineoplastic chemotherapy: Secondary | ICD-10-CM | POA: Diagnosis not present

## 2020-08-04 ENCOUNTER — Other Ambulatory Visit: Payer: Self-pay

## 2020-08-04 ENCOUNTER — Ambulatory Visit
Admission: RE | Admit: 2020-08-04 | Discharge: 2020-08-04 | Disposition: A | Discharge status: Home / Self Care | Payer: Medicare Other | Source: Ambulatory Visit | Attending: Urology | Admitting: Urology

## 2020-08-04 DIAGNOSIS — C61 Malignant neoplasm of prostate: Secondary | ICD-10-CM

## 2020-08-04 NOTE — Progress Notes (Signed)
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   Interval Since Last Radiation:  5.5 weeks concurrent with LT-ADT (37-month Eligard injection started 02/04/2020) 04/19/20: Insertion of radioactive I-125 seeds into the prostate gland; 110Gy, boost therapy.  05/24/20 - 06/25/20: The prostate and pelvic lymph nodes were treated to 45 Gy in 25 fractions of 1.8 Gy, to supplement an up-front prostate seed implant boost of 110 Gy to achieve a total nominal dose of 165 Gy.  Narrative:  I spoke with the patient to conduct his routine scheduled 1 month follow up visit via telephone to spare the patient unnecessary potential exposure in the healthcare setting during the current COVID-19 pandemic.  The patient was notified in advance and gave permission to proceed with this visit format. He tolerated radiation treatment relatively well with moderate urinary irritation and modest fatigue.  He did experience nocturia 4-5 times per night as well as increased frequency and urgency which did improve with 2 capsules of Flomax at bedtime.  He also reported intermittent diarrhea but specifically denied dysuria, gross hematuria or significant fatigue.  He continued to tolerate the ADT well throughout treatment.                              On review of systems, the patient states he reports that he is doing well in general.  His nocturia has improved to 2-3 times per night but he continues with increased urgency and frequency despite taking Flomax.  He had a recent follow-up visit with Dr. Milford Cage on 07/27/2020 and was encouraged to continue taking Flomax.  He specifically denies dysuria, gross hematuria, straining to void, incomplete  bladder emptying or incontinence.  He does continue with occasional diarrhea but denies fatigue.  He reports a healthy appetite and is maintaining his weight.  He has continued to tolerate the Eligard ADT fairly well with his most recent injection on 08/03/2020.  Overall, he is quite pleased with his progress to date.  ALLERGIES:  is allergic to bee venom and poison oak extract.  Meds: Current Outpatient Medications  Medication Sig Dispense Refill  . Ascorbic Acid (VITAMIN C) 1000 MG tablet Take 1,000 mg by mouth daily.    . cholecalciferol (VITAMIN D3) 25 MCG (1000 UNIT) tablet Take 1,000 Units by mouth daily.    Marland Kitchen latanoprost (XALATAN) 0.005 % ophthalmic solution Place into both eyes at bedtime.     Marland Kitchen leuprolide, 6 Month, (LEUPROLIDE ACETATE, 6 MONTH,) 45 MG injection Inject into the skin. Every 6 months    . Multiple Vitamins-Minerals (PRESERVISION AREDS 2 PO) Take by mouth in the morning and at bedtime.    . multivitamin-lutein (OCUVITE-LUTEIN) CAPS capsule Take 1 capsule by mouth daily.    . tamsulosin (FLOMAX) 0.4 MG CAPS capsule Take 2 capsules (0.8 mg total) by mouth daily after supper. 60 capsule 5  . oxyCODONE-acetaminophen (PERCOCET) 5-325 MG tablet Take 1 tablet by mouth every 4 (four) hours as needed for severe pain. (Patient not taking: Reported on 07/28/2020) 15 tablet 0   No current facility-administered medications for this encounter.    Physical Findings:  vitals were not taken for this visit.   /  Unable to assess due to telephone follow-up visit format. Lab Findings: Lab Results  Component Value Date   WBC 6.4 04/15/2020   HGB 13.3 04/15/2020   HCT 39.6 04/15/2020   MCV 95.4 04/15/2020   PLT 127 (L) 04/15/2020     Radiographic Findings: No results found.  Impression/Plan: 1. 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  He will continue to follow up with urology for ongoing PSA determinations and had  a recent follow-up appointment with Dr. Benancio Deeds on 07/27/2020.  His next scheduled follow-up visit is on 01/26/2021 when he will be due for his next Eligard injection.  He understands what to expect with regards to PSA monitoring going forward. I will look forward to following his response to treatment via correspondence with urology, and would be happy to continue to participate in his care if clinically indicated. I talked to the patient about what to expect in the future, including his risk for erectile dysfunction and rectal bleeding. I encouraged him to call or return to the office if he has any questions regarding his previous radiation or possible radiation side effects. He was comfortable with this plan and will follow up as needed.    Marguarite Arbour, PA-C

## 2020-08-04 NOTE — Progress Notes (Signed)
  Radiation Oncology         [phone] ________________________________  Name: Mitchell Curtis MRN: [id number]  Date: 06/25/2020  DOB: 12-27-1945  End of Treatment Note  Diagnosis:   74 y.o. gentleman with Stage T1c adenocarcinoma of the prostate with Gleason score of 4+5, and PSA of 11.     Indication for treatment:  Curative, Definitive Radiotherapy concurrent with LT-ADT      Radiation treatment dates:   04/19/20, 05/24/20 - 06/25/20  Site/dose:    04/19/20: Insertion of radioactive I-125 seeds into the prostate gland; 110 Gy, boost therapy.  05/24/20 - 06/25/20: The prostate and pelvic lymph nodes were treated to 45 Gy in 25 fractions of 1.8 Gy, to supplement an up-front prostate seed implant boost of 110 Gy to achieve a total nominal dose of 165 Gy.  Beams/energy:   The patient was treated with IMRT using volumetric arc therapy delivering 6 MV X-rays to clockwise and counterclockwise circumferential arcs with a 90 degree collimator offset to avoid dose scalloping.  Image guidance was performed with daily cone beam CT prior to each fraction to align to gold markers in the prostate and assure proper bladder and rectal fill volumes.  Immobilization was achieved with [employer] custom mold.  Narrative: The patient tolerated radiation treatment relatively well with moderate urinary irritation and modest fatigue.  He did experience nocturia 4-5 times per night as well as increased frequency and urgency which did improve with 2 capsules of Flomax at bedtime.  He also reported intermittent diarrhea but specifically denied dysuria, gross hematuria or significant fatigue.  He continued to tolerate the ADT well throughout treatment.  Plan: The patient has completed radiation treatment. He will return to radiation oncology clinic for routine followup in one month. I advised him to call or return sooner if he has any questions or concerns related to his recovery or  treatment. ________________________________  Artist Pais. Kathrynn Running, M.D.

## 2020-08-09 DIAGNOSIS — R945 Abnormal results of liver function studies: Secondary | ICD-10-CM | POA: Diagnosis not present

## 2020-08-09 DIAGNOSIS — E7800 Pure hypercholesterolemia, unspecified: Secondary | ICD-10-CM | POA: Diagnosis not present

## 2020-08-09 DIAGNOSIS — R55 Syncope and collapse: Secondary | ICD-10-CM | POA: Diagnosis not present

## 2020-09-09 ENCOUNTER — Encounter: Payer: Self-pay | Admitting: Medical Oncology

## 2020-09-09 NOTE — Progress Notes (Signed)
Patient called asking if he needs to continue taking Flomax that Dr. Tammi Klippel prescribed.In reading side effects of the medications he would like to stop if possible. He still has about 1/3 bottle of medication. I encouraged him to continue and follow up with Dr. Milford Cage.

## 2020-12-08 IMAGING — NM NM BONE WHOLE BODY
2 series · 2 of 2 positions shown · non-contrast
Comparison: CT abdomen pelvis from same day.

CLINICAL DATA: Prostate cancer.

EXAM:
NUCLEAR MEDICINE WHOLE BODY BONE SCAN
TECHNIQUE: Whole body anterior and posterior images were obtained approximately
3 hours after intravenous injection of radiopharmaceutical.
RADIOPHARMACEUTICALS:  20.1 mCi Uechnetium-UUm MDP IV

[Series 1: whole body · 2.66mm/px · 1 of 1 slices shown (1 of 2)]
[im 1/1]
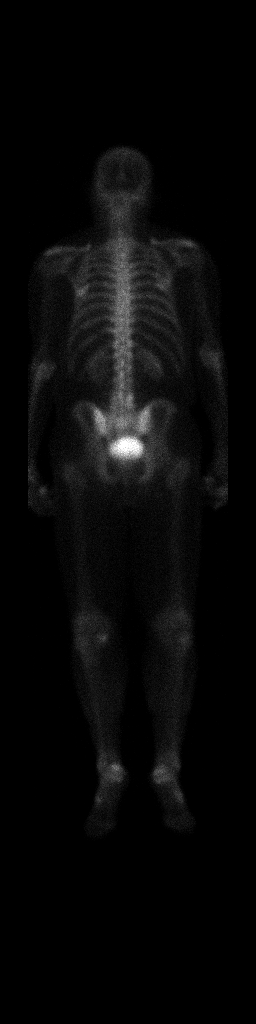

[Series 1: whole body · 2.66mm/px · 1 of 1 slices shown (2 of 2)]
[im 1/1]
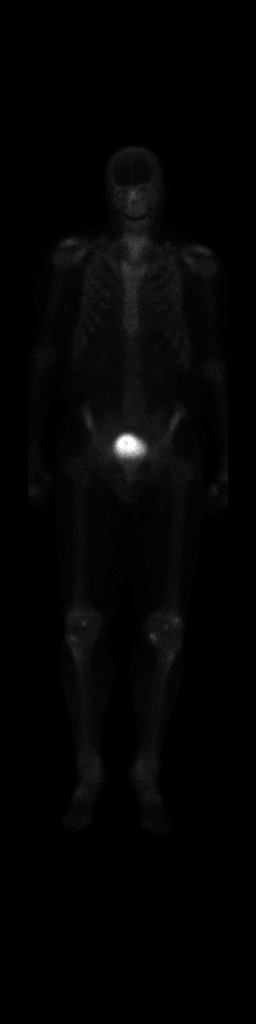

[2 of 2 positions shown; findings below may reference images not displayed]

FINDINGS: There are no foci of increased or decreased radiotracer uptake to
suggest osseous metastatic disease. Degenerative type uptake in both
shoulders and knees.

Normal physiologic activity is identified within the kidneys and
urinary bladder.
IMPRESSION: No evidence of osseous metastatic disease.

## 2021-01-26 DIAGNOSIS — C61 Malignant neoplasm of prostate: Secondary | ICD-10-CM | POA: Diagnosis not present

## 2021-01-28 IMAGING — CR DG CHEST 2V
2 series · 2 of 2 positions shown · non-contrast
Comparison: None.

CLINICAL DATA: Preop radioactive seed implantation

EXAM:
CHEST - 2 VIEW

[w chest pa]
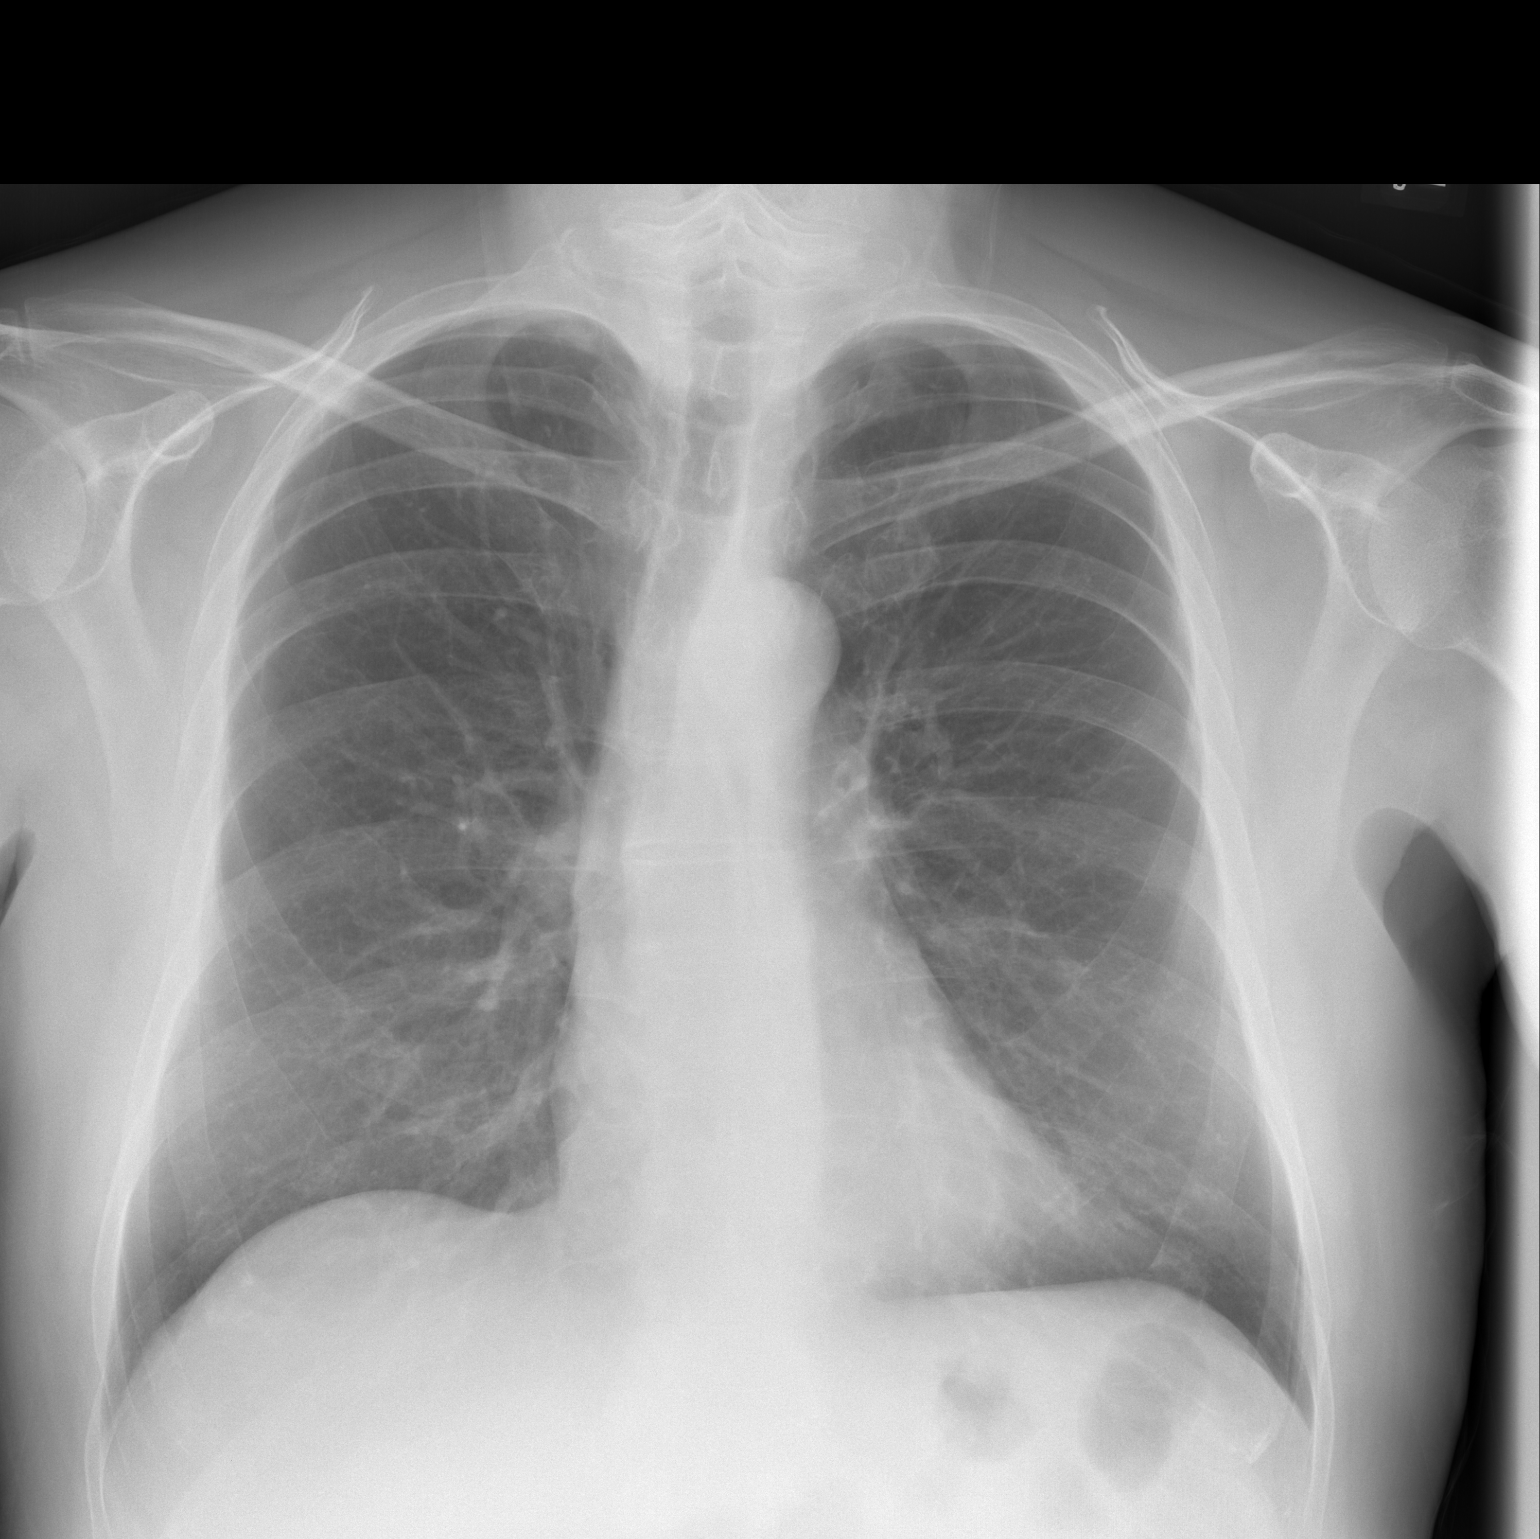

[w chest lat]
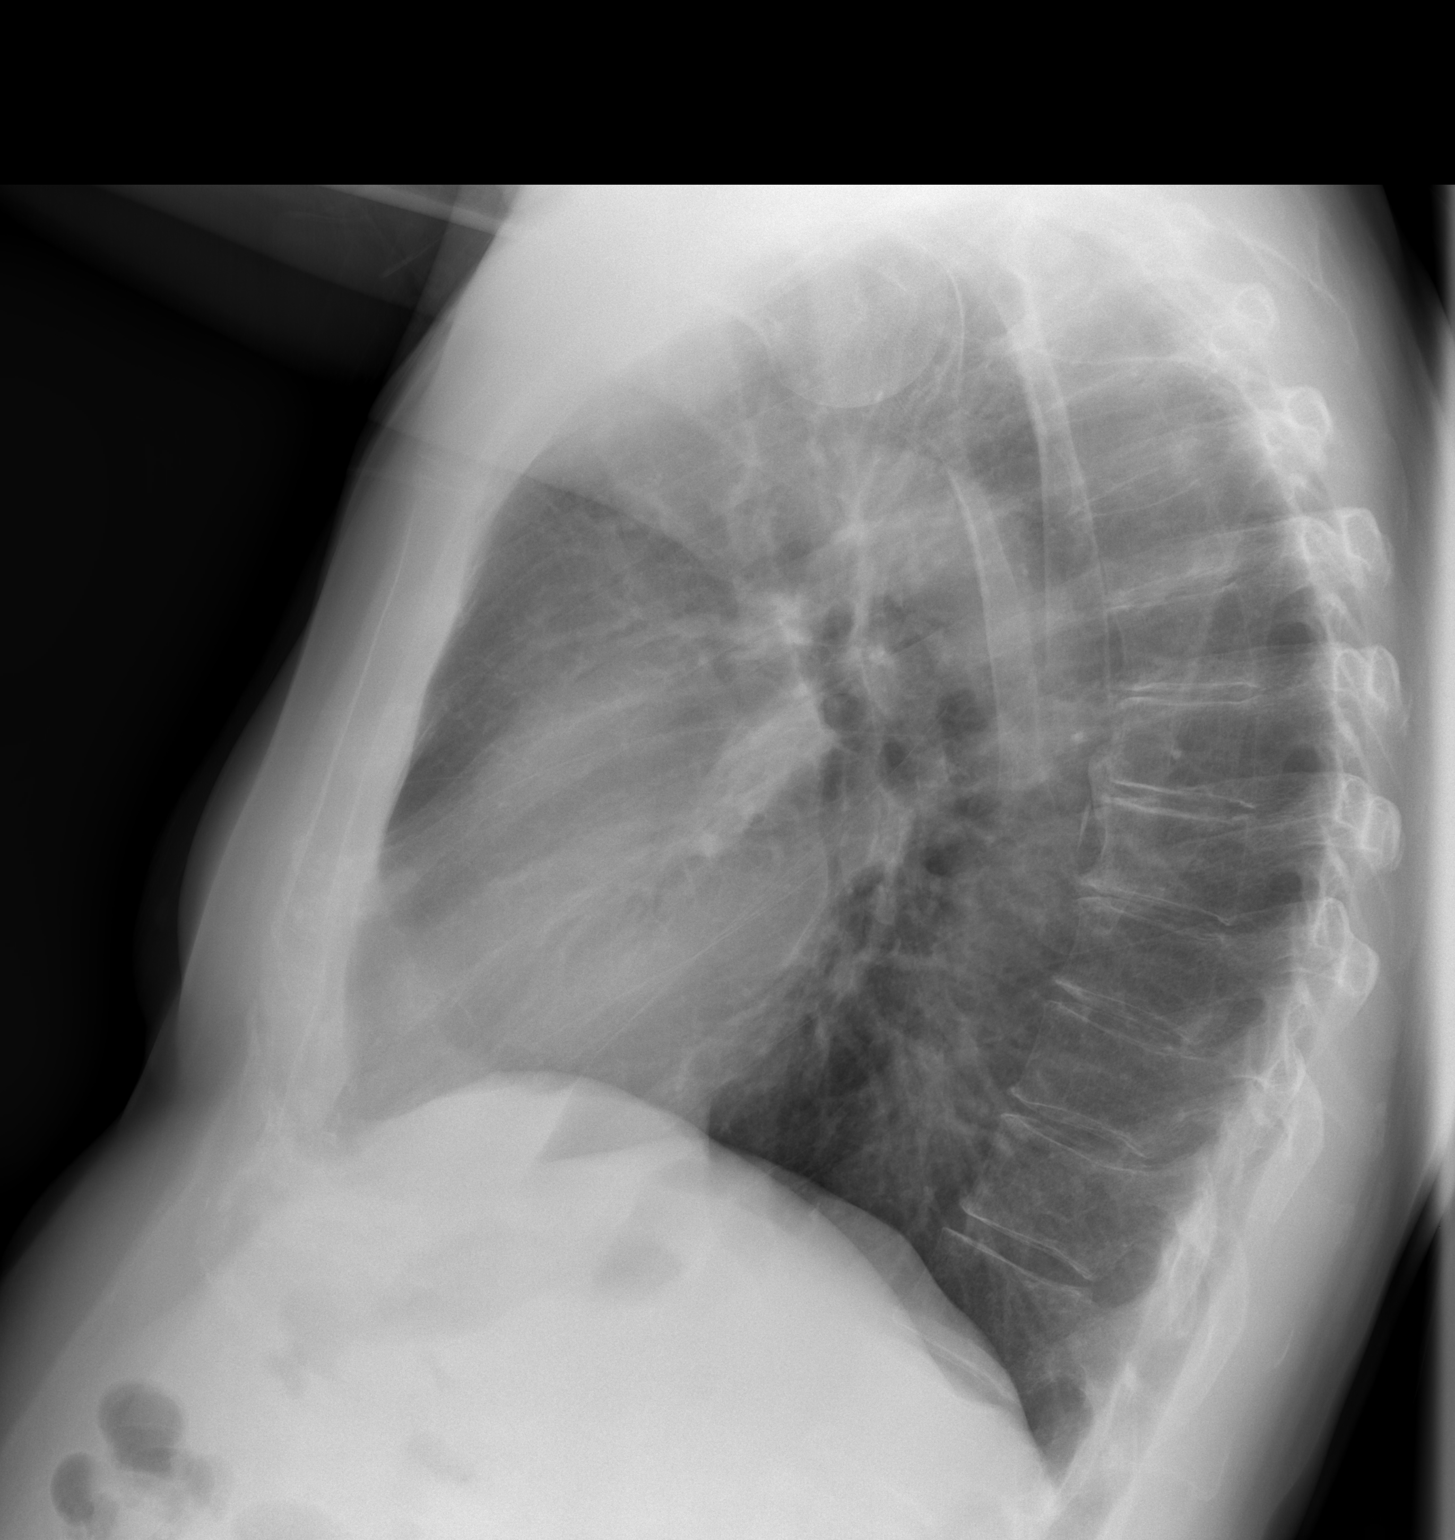

[2 of 2 positions shown; findings below may reference images not displayed]

FINDINGS: The heart size and mediastinal contours are within normal limits.
Both lungs are clear. Mild degenerative changes of the spine.
IMPRESSION: No active cardiopulmonary disease.

## 2021-02-01 DIAGNOSIS — C61 Malignant neoplasm of prostate: Secondary | ICD-10-CM | POA: Diagnosis not present

## 2021-02-01 DIAGNOSIS — N401 Enlarged prostate with lower urinary tract symptoms: Secondary | ICD-10-CM | POA: Diagnosis not present

## 2021-02-01 DIAGNOSIS — R3914 Feeling of incomplete bladder emptying: Secondary | ICD-10-CM | POA: Diagnosis not present

## 2021-02-25 DIAGNOSIS — H401132 Primary open-angle glaucoma, bilateral, moderate stage: Secondary | ICD-10-CM | POA: Diagnosis not present

## 2021-03-09 DIAGNOSIS — D229 Melanocytic nevi, unspecified: Secondary | ICD-10-CM | POA: Diagnosis not present

## 2021-03-09 DIAGNOSIS — L821 Other seborrheic keratosis: Secondary | ICD-10-CM | POA: Diagnosis not present

## 2021-03-09 DIAGNOSIS — L814 Other melanin hyperpigmentation: Secondary | ICD-10-CM | POA: Diagnosis not present

## 2021-03-09 DIAGNOSIS — I83813 Varicose veins of bilateral lower extremities with pain: Secondary | ICD-10-CM | POA: Diagnosis not present

## 2021-03-09 DIAGNOSIS — L819 Disorder of pigmentation, unspecified: Secondary | ICD-10-CM | POA: Diagnosis not present

## 2021-04-29 DIAGNOSIS — Z20822 Contact with and (suspected) exposure to covid-19: Secondary | ICD-10-CM | POA: Diagnosis not present

## 2021-05-23 DIAGNOSIS — Z23 Encounter for immunization: Secondary | ICD-10-CM | POA: Diagnosis not present

## 2021-05-30 DIAGNOSIS — Z20822 Contact with and (suspected) exposure to covid-19: Secondary | ICD-10-CM | POA: Diagnosis not present

## 2021-06-01 DIAGNOSIS — M6740 Ganglion, unspecified site: Secondary | ICD-10-CM | POA: Diagnosis not present

## 2021-06-30 DIAGNOSIS — Z20822 Contact with and (suspected) exposure to covid-19: Secondary | ICD-10-CM | POA: Diagnosis not present

## 2021-07-26 DIAGNOSIS — C61 Malignant neoplasm of prostate: Secondary | ICD-10-CM | POA: Diagnosis not present

## 2021-07-29 DIAGNOSIS — N401 Enlarged prostate with lower urinary tract symptoms: Secondary | ICD-10-CM | POA: Diagnosis not present

## 2021-07-29 DIAGNOSIS — C61 Malignant neoplasm of prostate: Secondary | ICD-10-CM | POA: Diagnosis not present

## 2021-07-29 DIAGNOSIS — R351 Nocturia: Secondary | ICD-10-CM | POA: Diagnosis not present

## 2021-08-05 DIAGNOSIS — C61 Malignant neoplasm of prostate: Secondary | ICD-10-CM | POA: Diagnosis not present

## 2021-08-05 DIAGNOSIS — Z5111 Encounter for antineoplastic chemotherapy: Secondary | ICD-10-CM | POA: Diagnosis not present

## 2021-08-18 DIAGNOSIS — E7800 Pure hypercholesterolemia, unspecified: Secondary | ICD-10-CM | POA: Diagnosis not present

## 2021-08-18 DIAGNOSIS — R7309 Other abnormal glucose: Secondary | ICD-10-CM | POA: Diagnosis not present

## 2021-08-18 DIAGNOSIS — R945 Abnormal results of liver function studies: Secondary | ICD-10-CM | POA: Diagnosis not present

## 2021-08-19 DIAGNOSIS — Z20822 Contact with and (suspected) exposure to covid-19: Secondary | ICD-10-CM | POA: Diagnosis not present

## 2021-08-24 DIAGNOSIS — Z0000 Encounter for general adult medical examination without abnormal findings: Secondary | ICD-10-CM | POA: Diagnosis not present

## 2021-08-24 DIAGNOSIS — R945 Abnormal results of liver function studies: Secondary | ICD-10-CM | POA: Diagnosis not present

## 2021-08-24 DIAGNOSIS — M2560 Stiffness of unspecified joint, not elsewhere classified: Secondary | ICD-10-CM | POA: Diagnosis not present

## 2021-08-24 DIAGNOSIS — M6749 Ganglion, multiple sites: Secondary | ICD-10-CM | POA: Diagnosis not present

## 2021-08-24 DIAGNOSIS — Z23 Encounter for immunization: Secondary | ICD-10-CM | POA: Diagnosis not present

## 2021-08-24 DIAGNOSIS — E7800 Pure hypercholesterolemia, unspecified: Secondary | ICD-10-CM | POA: Diagnosis not present

## 2021-09-06 DIAGNOSIS — M19041 Primary osteoarthritis, right hand: Secondary | ICD-10-CM | POA: Diagnosis not present

## 2021-09-06 DIAGNOSIS — M79641 Pain in right hand: Secondary | ICD-10-CM | POA: Diagnosis not present

## 2021-09-21 DIAGNOSIS — Z20822 Contact with and (suspected) exposure to covid-19: Secondary | ICD-10-CM | POA: Diagnosis not present

## 2021-10-19 DIAGNOSIS — Z961 Presence of intraocular lens: Secondary | ICD-10-CM | POA: Diagnosis not present

## 2021-10-19 DIAGNOSIS — H401132 Primary open-angle glaucoma, bilateral, moderate stage: Secondary | ICD-10-CM | POA: Diagnosis not present

## 2021-10-19 DIAGNOSIS — H5213 Myopia, bilateral: Secondary | ICD-10-CM | POA: Diagnosis not present

## 2021-10-19 DIAGNOSIS — H43813 Vitreous degeneration, bilateral: Secondary | ICD-10-CM | POA: Diagnosis not present

## 2022-01-20 DIAGNOSIS — C61 Malignant neoplasm of prostate: Secondary | ICD-10-CM | POA: Diagnosis not present

## 2022-01-27 DIAGNOSIS — C61 Malignant neoplasm of prostate: Secondary | ICD-10-CM | POA: Diagnosis not present

## 2022-01-27 DIAGNOSIS — R3914 Feeling of incomplete bladder emptying: Secondary | ICD-10-CM | POA: Diagnosis not present

## 2022-01-27 DIAGNOSIS — N401 Enlarged prostate with lower urinary tract symptoms: Secondary | ICD-10-CM | POA: Diagnosis not present

## 2022-02-01 DIAGNOSIS — H401132 Primary open-angle glaucoma, bilateral, moderate stage: Secondary | ICD-10-CM | POA: Diagnosis not present

## 2022-04-04 DIAGNOSIS — H401112 Primary open-angle glaucoma, right eye, moderate stage: Secondary | ICD-10-CM | POA: Diagnosis not present

## 2022-05-30 DIAGNOSIS — H401122 Primary open-angle glaucoma, left eye, moderate stage: Secondary | ICD-10-CM | POA: Diagnosis not present

## 2022-07-20 DIAGNOSIS — C61 Malignant neoplasm of prostate: Secondary | ICD-10-CM | POA: Diagnosis not present

## 2022-07-27 DIAGNOSIS — R972 Elevated prostate specific antigen [PSA]: Secondary | ICD-10-CM | POA: Diagnosis not present

## 2022-07-27 DIAGNOSIS — C61 Malignant neoplasm of prostate: Secondary | ICD-10-CM | POA: Diagnosis not present

## 2022-07-27 DIAGNOSIS — N401 Enlarged prostate with lower urinary tract symptoms: Secondary | ICD-10-CM | POA: Diagnosis not present

## 2022-07-27 DIAGNOSIS — R3914 Feeling of incomplete bladder emptying: Secondary | ICD-10-CM | POA: Diagnosis not present

## 2022-08-24 DIAGNOSIS — R945 Abnormal results of liver function studies: Secondary | ICD-10-CM | POA: Diagnosis not present

## 2022-08-24 DIAGNOSIS — R7301 Impaired fasting glucose: Secondary | ICD-10-CM | POA: Diagnosis not present

## 2022-08-24 DIAGNOSIS — E7800 Pure hypercholesterolemia, unspecified: Secondary | ICD-10-CM | POA: Diagnosis not present

## 2022-08-29 DIAGNOSIS — Z6831 Body mass index (BMI) 31.0-31.9, adult: Secondary | ICD-10-CM | POA: Diagnosis not present

## 2022-08-29 DIAGNOSIS — E7800 Pure hypercholesterolemia, unspecified: Secondary | ICD-10-CM | POA: Diagnosis not present

## 2022-08-29 DIAGNOSIS — Z23 Encounter for immunization: Secondary | ICD-10-CM | POA: Diagnosis not present

## 2022-08-29 DIAGNOSIS — R945 Abnormal results of liver function studies: Secondary | ICD-10-CM | POA: Diagnosis not present

## 2022-08-29 DIAGNOSIS — M2540 Effusion, unspecified joint: Secondary | ICD-10-CM | POA: Diagnosis not present

## 2022-08-29 DIAGNOSIS — Z0000 Encounter for general adult medical examination without abnormal findings: Secondary | ICD-10-CM | POA: Diagnosis not present

## 2022-09-06 DIAGNOSIS — H401132 Primary open-angle glaucoma, bilateral, moderate stage: Secondary | ICD-10-CM | POA: Diagnosis not present

## 2022-12-06 DIAGNOSIS — L821 Other seborrheic keratosis: Secondary | ICD-10-CM | POA: Diagnosis not present

## 2022-12-06 DIAGNOSIS — D225 Melanocytic nevi of trunk: Secondary | ICD-10-CM | POA: Diagnosis not present

## 2022-12-06 DIAGNOSIS — L814 Other melanin hyperpigmentation: Secondary | ICD-10-CM | POA: Diagnosis not present

## 2023-01-10 DIAGNOSIS — H401132 Primary open-angle glaucoma, bilateral, moderate stage: Secondary | ICD-10-CM | POA: Diagnosis not present

## 2023-01-10 DIAGNOSIS — H04123 Dry eye syndrome of bilateral lacrimal glands: Secondary | ICD-10-CM | POA: Diagnosis not present

## 2023-01-10 DIAGNOSIS — H43813 Vitreous degeneration, bilateral: Secondary | ICD-10-CM | POA: Diagnosis not present

## 2023-01-11 DIAGNOSIS — C61 Malignant neoplasm of prostate: Secondary | ICD-10-CM | POA: Diagnosis not present

## 2023-01-18 DIAGNOSIS — C61 Malignant neoplasm of prostate: Secondary | ICD-10-CM | POA: Diagnosis not present

## 2023-01-18 DIAGNOSIS — N401 Enlarged prostate with lower urinary tract symptoms: Secondary | ICD-10-CM | POA: Diagnosis not present

## 2023-01-18 DIAGNOSIS — R3914 Feeling of incomplete bladder emptying: Secondary | ICD-10-CM | POA: Diagnosis not present

## 2023-05-08 DIAGNOSIS — H401122 Primary open-angle glaucoma, left eye, moderate stage: Secondary | ICD-10-CM | POA: Diagnosis not present

## 2023-07-17 DIAGNOSIS — C61 Malignant neoplasm of prostate: Secondary | ICD-10-CM | POA: Diagnosis not present

## 2023-07-24 DIAGNOSIS — C61 Malignant neoplasm of prostate: Secondary | ICD-10-CM | POA: Diagnosis not present

## 2023-07-24 DIAGNOSIS — R3914 Feeling of incomplete bladder emptying: Secondary | ICD-10-CM | POA: Diagnosis not present

## 2023-09-03 DIAGNOSIS — R945 Abnormal results of liver function studies: Secondary | ICD-10-CM | POA: Diagnosis not present

## 2023-09-03 DIAGNOSIS — E7800 Pure hypercholesterolemia, unspecified: Secondary | ICD-10-CM | POA: Diagnosis not present

## 2023-09-05 DIAGNOSIS — Z6832 Body mass index (BMI) 32.0-32.9, adult: Secondary | ICD-10-CM | POA: Diagnosis not present

## 2023-09-05 DIAGNOSIS — R7301 Impaired fasting glucose: Secondary | ICD-10-CM | POA: Diagnosis not present

## 2023-09-05 DIAGNOSIS — E7800 Pure hypercholesterolemia, unspecified: Secondary | ICD-10-CM | POA: Diagnosis not present

## 2023-09-05 DIAGNOSIS — R945 Abnormal results of liver function studies: Secondary | ICD-10-CM | POA: Diagnosis not present

## 2023-09-05 DIAGNOSIS — Z0000 Encounter for general adult medical examination without abnormal findings: Secondary | ICD-10-CM | POA: Diagnosis not present

## 2023-10-11 DIAGNOSIS — H26492 Other secondary cataract, left eye: Secondary | ICD-10-CM | POA: Diagnosis not present

## 2023-10-11 DIAGNOSIS — H401132 Primary open-angle glaucoma, bilateral, moderate stage: Secondary | ICD-10-CM | POA: Diagnosis not present

## 2023-10-11 DIAGNOSIS — H43813 Vitreous degeneration, bilateral: Secondary | ICD-10-CM | POA: Diagnosis not present

## 2023-12-12 DIAGNOSIS — L814 Other melanin hyperpigmentation: Secondary | ICD-10-CM | POA: Diagnosis not present

## 2023-12-12 DIAGNOSIS — L570 Actinic keratosis: Secondary | ICD-10-CM | POA: Diagnosis not present

## 2023-12-12 DIAGNOSIS — I872 Venous insufficiency (chronic) (peripheral): Secondary | ICD-10-CM | POA: Diagnosis not present

## 2023-12-12 DIAGNOSIS — L821 Other seborrheic keratosis: Secondary | ICD-10-CM | POA: Diagnosis not present

## 2023-12-12 DIAGNOSIS — D225 Melanocytic nevi of trunk: Secondary | ICD-10-CM | POA: Diagnosis not present

## 2024-01-17 DIAGNOSIS — C61 Malignant neoplasm of prostate: Secondary | ICD-10-CM | POA: Diagnosis not present

## 2024-01-24 DIAGNOSIS — R3914 Feeling of incomplete bladder emptying: Secondary | ICD-10-CM | POA: Diagnosis not present

## 2024-01-24 DIAGNOSIS — C61 Malignant neoplasm of prostate: Secondary | ICD-10-CM | POA: Diagnosis not present

## 2024-02-14 DIAGNOSIS — H401132 Primary open-angle glaucoma, bilateral, moderate stage: Secondary | ICD-10-CM | POA: Diagnosis not present

## 2024-06-12 DIAGNOSIS — H401132 Primary open-angle glaucoma, bilateral, moderate stage: Secondary | ICD-10-CM | POA: Diagnosis not present

## 2024-07-17 DIAGNOSIS — C61 Malignant neoplasm of prostate: Secondary | ICD-10-CM | POA: Diagnosis not present

## 2024-07-22 DIAGNOSIS — C61 Malignant neoplasm of prostate: Secondary | ICD-10-CM | POA: Diagnosis not present

## 2024-07-22 DIAGNOSIS — N401 Enlarged prostate with lower urinary tract symptoms: Secondary | ICD-10-CM | POA: Diagnosis not present
